# Patient Record
Sex: Male | Born: 1960 | Race: White | Hispanic: No | Marital: Married | Smoking: Former smoker
Health system: Southern US, Community
[De-identification: ages and names within clinical notes are randomized; demographics above are authoritative.]

## PROBLEM LIST (undated history)

## (undated) DIAGNOSIS — T4145XA Adverse effect of unspecified anesthetic, initial encounter: Secondary | ICD-10-CM

## (undated) DIAGNOSIS — I10 Essential (primary) hypertension: Secondary | ICD-10-CM

## (undated) DIAGNOSIS — T8859XA Other complications of anesthesia, initial encounter: Secondary | ICD-10-CM

## (undated) DIAGNOSIS — E8809 Other disorders of plasma-protein metabolism, not elsewhere classified: Secondary | ICD-10-CM

## (undated) HISTORY — PX: OTHER SURGICAL HISTORY: SHX169

---

## 2013-12-02 LAB — BASIC METABOLIC PANEL
Creatinine: 1.1 mg/dL (ref 0.6–1.3)
GLUCOSE: 90 mg/dL

## 2013-12-02 LAB — CBC AND DIFFERENTIAL
Neutrophils Absolute: 3 /uL
WBC: 6.4 10^3/mL

## 2013-12-02 LAB — HEPATIC FUNCTION PANEL: Bilirubin, Total: 0.4 mg/dL

## 2014-06-21 DIAGNOSIS — I10 Essential (primary) hypertension: Secondary | ICD-10-CM | POA: Insufficient documentation

## 2015-02-22 DIAGNOSIS — Z87442 Personal history of urinary calculi: Secondary | ICD-10-CM | POA: Insufficient documentation

## 2015-04-27 DIAGNOSIS — E8889 Other specified metabolic disorders: Secondary | ICD-10-CM | POA: Insufficient documentation

## 2015-06-07 DIAGNOSIS — N200 Calculus of kidney: Secondary | ICD-10-CM | POA: Insufficient documentation

## 2015-07-18 ENCOUNTER — Emergency Department: Payer: PRIVATE HEALTH INSURANCE

## 2015-07-18 ENCOUNTER — Other Ambulatory Visit: Payer: Self-pay

## 2015-07-18 ENCOUNTER — Encounter: Payer: Self-pay | Admitting: Medical Oncology

## 2015-07-18 ENCOUNTER — Emergency Department
Admission: EM | Admit: 2015-07-18 | Discharge: 2015-07-18 | Disposition: A | Discharge status: Home / Self Care | Payer: PRIVATE HEALTH INSURANCE | Attending: Emergency Medicine | Admitting: Emergency Medicine

## 2015-07-18 DIAGNOSIS — I10 Essential (primary) hypertension: Secondary | ICD-10-CM | POA: Insufficient documentation

## 2015-07-18 DIAGNOSIS — R1013 Epigastric pain: Secondary | ICD-10-CM | POA: Diagnosis present

## 2015-07-18 DIAGNOSIS — K8500 Idiopathic acute pancreatitis without necrosis or infection: Secondary | ICD-10-CM | POA: Diagnosis not present

## 2015-07-18 HISTORY — DX: Essential (primary) hypertension: I10

## 2015-07-18 HISTORY — DX: Other disorders of plasma-protein metabolism, not elsewhere classified: E88.09

## 2015-07-18 LAB — COMPREHENSIVE METABOLIC PANEL
ALBUMIN: 4.9 g/dL (ref 3.5–5.0)
ALT: 20 U/L (ref 17–63)
ANION GAP: 7 (ref 5–15)
AST: 26 U/L (ref 15–41)
Alkaline Phosphatase: 77 U/L (ref 38–126)
BILIRUBIN TOTAL: 0.7 mg/dL (ref 0.3–1.2)
BUN: 18 mg/dL (ref 6–20)
CHLORIDE: 105 mmol/L (ref 101–111)
CO2: 27 mmol/L (ref 22–32)
Calcium: 9.5 mg/dL (ref 8.9–10.3)
Creatinine, Ser: 0.97 mg/dL (ref 0.61–1.24)
GFR calc Af Amer: >60 mL/min (ref >60)
Glucose, Bld: 142 mg/dL — ABNORMAL HIGH (ref 65–99)
POTASSIUM: 3.8 mmol/L (ref 3.5–5.1)
Sodium: 139 mmol/L (ref 135–145)
TOTAL PROTEIN: 8 g/dL (ref 6.5–8.1)

## 2015-07-18 LAB — URINALYSIS COMPLETE WITH MICROSCOPIC (ARMC ONLY)
BACTERIA UA: NONE SEEN
Bilirubin Urine: NEGATIVE
Glucose, UA: NEGATIVE mg/dL
Hgb urine dipstick: NEGATIVE
Ketones, ur: NEGATIVE mg/dL
LEUKOCYTES UA: NEGATIVE
NITRITE: NEGATIVE
PH: 7 (ref 5.0–8.0)
PROTEIN: NEGATIVE mg/dL
Specific Gravity, Urine: >1.03 — ABNORMAL HIGH (ref 1.005–1.030)

## 2015-07-18 LAB — CBC
HEMATOCRIT: 44.6 % (ref 40.0–52.0)
HEMOGLOBIN: 14.4 g/dL (ref 13.0–18.0)
MCH: 28.7 pg (ref 26.0–34.0)
MCHC: 32.3 g/dL (ref 32.0–36.0)
MCV: 88.8 fL (ref 80.0–100.0)
Platelets: 302 10*3/uL (ref 150–440)
RBC: 5.02 MIL/uL (ref 4.40–5.90)
RDW: 13.6 % (ref 11.5–14.5)
WBC: 23.9 10*3/uL — AB (ref 3.8–10.6)

## 2015-07-18 LAB — LIPASE, BLOOD: LIPASE: 63 U/L — AB (ref 11–51)

## 2015-07-18 MED ORDER — IOHEXOL 350 MG/ML SOLN
100.0000 mL | Freq: Once | INTRAVENOUS | Status: AC | PRN
  Administered 2015-07-18: 100 mL via INTRAVENOUS

## 2015-07-18 MED ORDER — HYDROMORPHONE HCL 1 MG/ML IJ SOLN
1.0000 mg | Freq: Once | INTRAMUSCULAR | Status: AC
  Administered 2015-07-18: 1 mg via INTRAVENOUS
  Filled 2015-07-18: qty 1

## 2015-07-18 MED ORDER — ONDANSETRON HCL 4 MG/2ML IJ SOLN
4.0000 mg | Freq: Once | INTRAMUSCULAR | Status: AC
  Administered 2015-07-18: 4 mg via INTRAVENOUS
  Filled 2015-07-18: qty 2

## 2015-07-18 MED ORDER — IOHEXOL 240 MG/ML SOLN
25.0000 mL | Freq: Once | INTRAMUSCULAR | Status: AC | PRN
  Administered 2015-07-18: 25 mL via ORAL

## 2015-07-18 MED ORDER — OXYCODONE-ACETAMINOPHEN 5-325 MG PO TABS: 2.0000 | ORAL_TABLET | Freq: Four times a day (QID) | ORAL | Status: DC | PRN

## 2015-07-18 MED ORDER — ONDANSETRON HCL 4 MG PO TABS: 4.0000 mg | ORAL_TABLET | Freq: Every day | ORAL | Status: DC | PRN

## 2015-07-18 MED ORDER — PANTOPRAZOLE SODIUM 40 MG PO TBEC: 40.0000 mg | DELAYED_RELEASE_TABLET | Freq: Every day | ORAL | Status: DC

## 2015-07-18 NOTE — ED Notes (Signed)
Discussed discharge instructions, prescriptions, and follow-up care with patient. No questions or concerns at this time. Pt stable at discharge.  

## 2015-07-18 NOTE — ED Notes (Signed)
Pt ambulatory to triage with reports epigastic pain since 10pm also reports nausea with vomiting x 1.

## 2015-07-18 NOTE — ED Provider Notes (Addendum)
[facility] Emergency Department Provider Note     Time seen: ----------------------------------------- 8:45 AM on 07/18/2015 -----------------------------------------    I have reviewed the triage vital signs and the nursing notes.   HISTORY  Chief Complaint Abdominal Pain    HPI Nochum Fenter is a 54 y.o. male who presents to ER with epigastric pain since 10 PM last night with associated nausea and vomiting times one. Patient states is not currently nauseous, has never had this pain before, some in the epigastrium. Nothing makes it better or worse.   Past Medical History  Diagnosis Date  . Hypertension   . Pseudocholinesterase deficiency     There are no active problems to display for this patient.   Past Surgical History  Procedure Laterality Date  . Kidney stones removed      Allergies Sulfa antibiotics  Social History Social History  Substance Use Topics  . Smoking status: Never Smoker   . Smokeless tobacco: None  . Alcohol Use: No    Review of Systems Constitutional: Negative for fever. Eyes: Negative for visual changes. ENT: Negative for sore throat. Cardiovascular: Negative for chest pain. Respiratory: Negative for shortness of breath. Gastrointestinal: Positive for abdominal pain and vomiting Genitourinary: Negative for dysuria. Musculoskeletal: Negative for back pain. Skin: Negative for rash. Neurological: Negative for headaches, focal weakness or numbness.  10-point ROS otherwise negative.  ____________________________________________   PHYSICAL EXAM:  VITAL SIGNS: ED Triage Vitals  Enc Vitals Group     BP 07/18/15 0817 166/63 mmHg     Pulse Rate 07/18/15 0817 71     Resp 07/18/15 0817 20     Temp 07/18/15 0817 97.9 F (36.6 C)     Temp Source 07/18/15 0817 Oral     SpO2 07/18/15 0817 99 %     Weight 07/18/15 0817 207 lb (93.895 kg)     Height 07/18/15 0817  (1.702 m)     Head Cir --      Peak  Flow --      Pain Score 07/18/15 0818 4     Pain Loc --      Pain Edu? --      Excl. in GC? --     Constitutional: Alert and oriented. Well appearing and in no distress. Eyes: Conjunctivae are normal. PERRL. Normal extraocular movements. ENT   Head: Normocephalic and atraumatic.   Nose: No congestion/rhinnorhea.   Mouth/Throat: Mucous membranes are moist.   Neck: No stridor. Cardiovascular: Normal rate, regular rhythm. Normal and symmetric distal pulses are present in all extremities. No murmurs, rubs, or gallops. Respiratory: Normal respiratory effort without tachypnea nor retractions. Breath sounds are clear and equal bilaterally. No wheezes/rales/rhonchi. Gastrointestinal: Mild epigastric tenderness, no rebound or guarding. Normal bowel sounds. Musculoskeletal: Nontender with normal range of motion in all extremities. No joint effusions.  No lower extremity tenderness nor edema. Neurologic:  Normal speech and language. No gross focal neurologic deficits are appreciated. Speech is normal. No gait instability. Skin:  Skin is warm, dry and intact. No rash noted. Psychiatric: Mood and affect are normal. Speech and behavior are normal. Patient exhibits appropriate insight and judgment. ____________________________________________  EKG: Interpreted by me. Normal sinus rhythm with a rate of 74 bpm, normal PR interval, normal QRS width, normal QT interval.  ____________________________________________  ED COURSE:  Pertinent labs & imaging results that were available during my care of the patient were reviewed by me and considered in my medical decision making (see chart for details). Patient  is in no acute distress, will check basic labs. Suspect GI etiology. ____________________________________________    LABS (pertinent positives/negatives)  Labs Reviewed  LIPASE, BLOOD - Abnormal; Notable for the following:    Lipase 63 (*)    All other components within normal limits   COMPREHENSIVE METABOLIC PANEL - Abnormal; Notable for the following:    Glucose, Bld 142 (*)    All other components within normal limits  CBC - Abnormal; Notable for the following:    WBC 23.9 (*)    All other components within normal limits  URINALYSIS COMPLETEWITH MICROSCOPIC ([facility] ONLY)    RADIOLOGY Images were viewed by me  Abdomen 2 view IMPRESSION: No acute abnormality or finding to explain the patient's symptoms.  Small fat containing left periumbilical hernia.  0.5 cm and 0.4 cm nonobstructing left renal stones. There may also be a punctate nonobstructing stone in the upper pole the right kidney. ____________________________________________  FINAL ASSESSMENT AND PLAN  Epigastric pain, pancreatitis  Plan: Patient with labs and imaging as dictated above. Patient with leukocytosis of unclear etiology, this may be a stress response to what looks like pancreatitis. Patient states she does not drink alcohol, and gallbladder looks normal. He will be discharged with pain medicine and encouraged to have close follow-up with his doctor in for evaluation   Emily FilbertWilliams, Mehgan Santmyer E, MD   Emily FilbertJonathan E Anaisabel Pederson, MD 07/18/15 1027  Emily FilbertJonathan E Gwendlyon Zumbro, MD 07/18/15 [phone]

## 2015-07-18 NOTE — Discharge Instructions (Signed)

## 2015-08-17 ENCOUNTER — Ambulatory Visit: Payer: Self-pay | Admitting: Physician Assistant

## 2015-08-24 ENCOUNTER — Ambulatory Visit
Admission: RE | Admit: 2015-08-24 | Discharge: 2015-08-24 | Disposition: A | Discharge status: Home / Self Care | Payer: BLUE CROSS/BLUE SHIELD | Source: Ambulatory Visit | Attending: Physician Assistant | Admitting: Physician Assistant

## 2015-08-24 ENCOUNTER — Encounter: Payer: Self-pay | Admitting: Physician Assistant

## 2015-08-24 ENCOUNTER — Telehealth: Payer: Self-pay

## 2015-08-24 ENCOUNTER — Ambulatory Visit (INDEPENDENT_AMBULATORY_CARE_PROVIDER_SITE_OTHER): Payer: BLUE CROSS/BLUE SHIELD | Admitting: Physician Assistant

## 2015-08-24 VITALS — BP 130/80 | HR 60 | Temp 97.6°F | Resp 16 | Ht 67.0 in | Wt 217.4 lb

## 2015-08-24 DIAGNOSIS — Z7689 Persons encountering health services in other specified circumstances: Secondary | ICD-10-CM

## 2015-08-24 DIAGNOSIS — M25562 Pain in left knee: Secondary | ICD-10-CM

## 2015-08-24 DIAGNOSIS — Z7189 Other specified counseling: Secondary | ICD-10-CM

## 2015-08-24 DIAGNOSIS — I10 Essential (primary) hypertension: Secondary | ICD-10-CM

## 2015-08-24 NOTE — Telephone Encounter (Signed)
-----   Message from Margaretann Loveless, [place] sent at 08/24/2015 10:52 AM EST ----- X-ray does show mild arthritis in the medial compartment and behind the kneecap. Continue current treatment plan as discussed and we will plan on a cortisone injection when you return in 4 weeks.

## 2015-08-24 NOTE — Telephone Encounter (Signed)
Patient advised as below. sd 

## 2015-08-24 NOTE — Progress Notes (Signed)
Patient: Maurice Lee, Male    DOB: [birth year], 55 y.o.   MRN: [id number] Visit Date: 08/24/2015  Today's Provider: Margaretann Loveless, PA-C   Chief Complaint  Patient presents with  . Establish Care  . Knee Pain   Subjective:    Establish Care: Maurice Lee is a 55 y.o. male who presents today to established care as a new patient. He feels well. He reports exercising-at work. He reports he is sleeping well.   Knee Pain: Patient presents with knee pain involving the  left knee. Onset of the symptoms was several years ago. Inciting event: none known. Current symptoms include pain. Pain is aggravated by with the cold weather..  Patient has had prior knee problems. Evaluation to date: Xray with previous doctor. Treatment to date: Naproxen.    Review of Systems  Constitutional: Negative.   HENT: Negative.   Eyes: Negative.   Respiratory: Negative.   Cardiovascular: Negative.   Gastrointestinal: Negative.   Endocrine: Negative.   Genitourinary: Negative.   Musculoskeletal: Positive for arthralgias.  Skin: Negative.   Allergic/Immunologic: Negative.   Neurological: Negative.   Hematological: Negative.   Psychiatric/Behavioral: Negative.     Social History      He  reports that he has quit smoking. He does not have any smokeless tobacco history on file. He reports that he does not drink alcohol or use illicit drugs.       Social History   Social History  . Marital Status: Married    Spouse Name: N/A  . Number of Children: N/A  . Years of Education: N/A   Social History Main Topics  . Smoking status: Former Games developer  . Smokeless tobacco: None  . Alcohol Use: No  . Drug Use: No  . Sexual Activity: Not Asked   Other Topics Concern  . None   Social History Narrative    Past Medical History  Diagnosis Date  . Hypertension   . Pseudocholinesterase deficiency      Patient Active Problem List   Diagnosis Date Noted  . Calculus of kidney  06/07/2015  . Serum cholinesterase defect (HCC) 04/27/2015  . H/O renal calculi 02/22/2015  . Essential (primary) hypertension 06/21/2014    Past Surgical History  Procedure Laterality Date  . Kidney stones removed      Family History        No family status information on file.        His family history is not on file.    Allergies  Allergen Reactions  . 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 Sulfa Antibiotics     Previous Medications   LISINOPRIL (PRINIVIL,ZESTRIL) 40 MG TABLET    Take 1 tablet by mouth daily at 12 noon.    NAPROXEN DR 500 MG EC TABLET    Take 1 tablet by mouth 2 (two) times daily as needed.   OXYCODONE-ACETAMINOPHEN (PERCOCET) 5-325 MG TABLET    Take 2 tablets by mouth every 6 (six) hours as needed for moderate pain or severe pain.   PANTOPRAZOLE (PROTONIX) 40 MG TABLET    Take 1 tablet (40 mg total) by mouth daily.   RANITIDINE (ZANTAC) 300 MG TABLET    Take 1 tablet by mouth as needed.    Patient Care Team:  Margaretann Loveless, PA-C as PCP - General (Family Medicine)     Objective:   Vitals: BP 130/80 mmHg  Pulse 60  Temp(Src) 97.6 F (36.4 C) (Oral)  Resp 16  Ht  (1.702 m)  Wt 217 lb 6.4 oz (98.612 kg)  BMI 34.04 kg/m2   Physical Exam  Constitutional: He is oriented to person, place, and time. He appears well-developed and well-nourished.  HENT:  Head: Normocephalic and atraumatic.  Right Ear: External ear normal.  Left Ear: External ear normal.  Nose: Nose normal.  Mouth/Throat: Oropharynx is clear and moist.  Eyes: Conjunctivae and EOM are normal. Pupils are equal, round, and reactive to light. Right eye exhibits no discharge.  Neck: Normal range of motion. Neck supple. No tracheal deviation present. No thyromegaly present.   Cardiovascular: Normal rate, regular rhythm, normal heart sounds and intact distal pulses.   No murmur heard. Pulmonary/Chest: Effort normal and breath sounds normal. No respiratory distress. He has no wheezes. He has no rales. He exhibits no tenderness.  Abdominal: Soft. He exhibits no distension and no mass. There is no tenderness. There is no rebound and no guarding.  Musculoskeletal: Normal range of motion. He exhibits no edema.       Right knee: Normal.       Left knee: He exhibits normal range of motion, no swelling, no effusion, no erythema, normal alignment, no LCL laxity, normal patellar mobility, no bony tenderness, normal meniscus and no MCL laxity. Tenderness found. Medial joint line tenderness noted. No lateral joint line, no MCL, no LCL and no patellar tendon tenderness noted.  Lymphadenopathy:    He has no cervical adenopathy.  Neurological: He is alert and oriented to person, place, and time. He has normal reflexes. No cranial nerve deficit. He exhibits normal muscle tone. Coordination normal.  Skin: Skin is warm and dry. No rash noted. No erythema.  Psychiatric: He has a normal mood and affect. His behavior is normal. Judgment and thought content normal.     Depression Screen No flowsheet data found.    Assessment & Plan:     Routine Health Maintenance and Physical Exam  1. Establishing care with new doctor, encounter for  2. Left knee pain Left knee did have medial joint line tenderness. All range of motion testing, ligament and meniscal testing and strength testing were all within normal limits. I will get an x-ray of the left knee to evaluate for possible arthritis. He is to continue taking naproxen twice daily as needed for pain. On bad days he may take 2 pills twice daily of naproxen. I will see him back in 4 weeks to see how he is doing and consider cortisone injection at that time if x-ray is normal. - DG Knee Complete 4 Views Left; Future  3. Essential  (primary) hypertension I will have him discontinue lisinopril for the next 4 weeks as he does not feel it is helping his blood pressure. I did advise him to keep a log of his blood pressures without the medication and I will see him back in 4 weeks. He is to bring this log with him for this return visit. If blood pressure is elevated at home readings then we will add a different agent to see if we can achieve better control of his blood pressure. He is to call the office if he has any acute issues, questions or concerns in the meantime.   Exercise Activities and Dietary recommendations Goals    None  There is no immunization history on file for this patient.  Health Maintenance  Topic Date Due  . Hepatitis C Screening  [birth year]  . HIV Screening  09/20/1977  . TETANUS/TDAP  09/20/1981  . COLONOSCOPY  09/20/2012  . INFLUENZA VACCINE  02/27/2015      Discussed health benefits of physical activity, and encouraged him to engage in regular exercise appropriate for his age and condition.    --------------------------------------------------------------------

## 2015-08-24 NOTE — Patient Instructions (Signed)

## 2015-09-18 ENCOUNTER — Ambulatory Visit: Payer: BLUE CROSS/BLUE SHIELD | Admitting: Physician Assistant

## 2015-09-26 ENCOUNTER — Encounter: Payer: Self-pay | Admitting: Physician Assistant

## 2015-09-26 ENCOUNTER — Ambulatory Visit (INDEPENDENT_AMBULATORY_CARE_PROVIDER_SITE_OTHER): Payer: BLUE CROSS/BLUE SHIELD | Admitting: Physician Assistant

## 2015-09-26 VITALS — BP 150/90 | HR 71 | Temp 97.8°F | Resp 16 | Wt 217.2 lb

## 2015-09-26 DIAGNOSIS — M1712 Unilateral primary osteoarthritis, left knee: Secondary | ICD-10-CM | POA: Diagnosis not present

## 2015-09-26 DIAGNOSIS — I10 Essential (primary) hypertension: Secondary | ICD-10-CM | POA: Diagnosis not present

## 2015-09-26 DIAGNOSIS — N200 Calculus of kidney: Secondary | ICD-10-CM

## 2015-09-26 DIAGNOSIS — M25562 Pain in left knee: Secondary | ICD-10-CM

## 2015-09-26 DIAGNOSIS — M222X2 Patellofemoral disorders, left knee: Secondary | ICD-10-CM | POA: Diagnosis not present

## 2015-09-26 MED ORDER — NAPROXEN DR 500 MG PO TBEC: 500.0000 mg | DELAYED_RELEASE_TABLET | Freq: Two times a day (BID) | ORAL | Status: DC | PRN

## 2015-09-26 MED ORDER — METHYLPREDNISOLONE ACETATE 80 MG/ML IJ SUSP
80.0000 mg | Freq: Once | INTRAMUSCULAR | Status: AC
  Administered 2015-09-27: 80 mg via INTRA_ARTICULAR

## 2015-09-26 MED ORDER — LISINOPRIL 40 MG PO TABS: 40.0000 mg | ORAL_TABLET | Freq: Every day | ORAL | Status: DC

## 2015-09-26 NOTE — Progress Notes (Signed)
Patient: Maurice Lee Male    DOB: September 01, 1960   55 y.o.   MRN: [id number] Visit Date: 09/26/2015  Today's Provider: Margaretann Loveless, PA-C   Chief Complaint  Patient presents with  . Follow-up    Hypertension and Left knee   Subjective:    HPI   Hypertension, follow-up:  BP Readings from Last 3 Encounters:  09/26/15 150/90  08/24/15 130/80  07/18/15 165/78    He was last seen for hypertension 4 weeks ago.  BP at that visit was 130/80. Management since that visit includes Lisinopril was discontinued and he was advised to keep a log of the blood pressure readings with out medication. He reports fair compliance with treatment. He is not having side effects.  He is exercising. He is adherent to low salt diet.   Outside blood pressures are 170's and 80's, but last night it was 180/100. He is experiencing none.  Patient denies chest pain, chest pressure/discomfort, fatigue, irregular heart beat, lower extremity edema, orthopnea, palpitations, paroxysmal nocturnal dyspnea and syncope.      Weight trend: stable Wt Readings from Last 3 Encounters:  09/26/15 217 lb 3.2 oz (98.521 kg)  08/24/15 217 lb 6.4 oz (98.612 kg)  07/18/15 207 lb (93.895 kg)    Current diet: in general, an "unhealthy" diet  Left Knee Pain: He is also here to follow-up on his left knee pain. He was advised to continue using Naproxen twice daily as needed for pain. His xray showed mild arthritis in the medial compartment and behind the patella. He says he feels the pain when it is cold.  He does do a lot of going up and down stairs when he is at work and he works 12 hr days.      Allergies  Allergen Reactions  . 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 Sulfa Antibiotics    Previous  Medications   NAPROXEN DR 500 MG EC TABLET    Take 1 tablet by mouth 2 (two) times daily as needed.   PANTOPRAZOLE (PROTONIX) 40 MG TABLET    Take 1 tablet (40 mg total) by mouth daily.   RANITIDINE (ZANTAC) 300 MG TABLET    Take 1 tablet by mouth as needed.    Review of Systems  Constitutional: Negative for fatigue.  HENT: Negative.   Eyes: Negative for visual disturbance.  Respiratory: Negative for cough, chest tightness and shortness of breath.   Cardiovascular: Negative for chest pain, palpitations and leg swelling.  Gastrointestinal: Negative for nausea, vomiting and abdominal pain.  Musculoskeletal: Positive for arthralgias (left knee). Negative for joint swelling and gait problem.  Neurological: Negative for dizziness, light-headedness and headaches.    Social History  Substance Use Topics  . Smoking status: Former Games developer  . Smokeless tobacco: Not on file  . Alcohol Use: No   Objective:   BP 150/90 mmHg  Pulse 71  Temp(Src) 97.8 F (36.6 C) (Oral)  Resp 16  Wt 217 lb 3.2 oz (98.521 kg)  Physical Exam  Constitutional: He appears well-developed and well-nourished. No distress.  HENT:  Head: Normocephalic and atraumatic.  Cardiovascular: Normal rate, regular rhythm and normal heart sounds.  Exam reveals no gallop and no friction rub.   No murmur heard. Pulmonary/Chest: Effort normal and breath  sounds normal. No respiratory distress. He has no wheezes. He has no rales.  Musculoskeletal:       Right knee: Normal.       Left knee: He exhibits normal range of motion, no swelling, no effusion, normal alignment, no LCL laxity, normal patellar mobility, no bony tenderness, normal meniscus and no MCL laxity. Tenderness found. Medial joint line and lateral joint line tenderness noted. No MCL, no LCL and no patellar tendon tenderness noted.  Skin: He is not diaphoretic.  Vitals reviewed.       Assessment & Plan:     1. Essential (primary) hypertension Will restart  lisinopril at previous dose since he had good control of BP at that time. Advised to adhere to low fat, low salt diet.  Try to limit portion size and begin an exercise regimen of 150 min physical activity per week per AHA guidelines. He is to keep a log of his BP readings over the next week or so with starting the medication back to make sure he is getting good control. I will see him back in 4 weeks to make sure that he has good control.  - lisinopril (PRINIVIL,ZESTRIL) 40 MG tablet; Take 1 tablet (40 mg total) by mouth daily.  Dispense: 90 tablet; Refill: 3  2. Primary osteoarthritis of left knee Cortisone injection given into left knee today without complication. See procedure note below. Naproxen refilled as below. May require referral to ortho if not much improvement.  Also consider PT as well. - methylPREDNISolone acetate (DEPO-MEDROL) injection 80 mg; Inject 1 mL (80 mg total) into the articular space once. - NAPROXEN DR 500 MG EC tablet; Take 1 tablet (500 mg total) by mouth 2 (two) times daily as needed.  Dispense: 60 tablet; Refill: 0  3. Patellofemoral arthralgia of left knee See above medical treatment plan. - methylPREDNISolone acetate (DEPO-MEDROL) injection 80 mg; Inject 1 mL (80 mg total) into the articular space once. - NAPROXEN DR 500 MG EC tablet; Take 1 tablet (500 mg total) by mouth 2 (two) times daily as needed.  Dispense: 60 tablet; Refill: 0  4. Kidney stones H/O kidney stones and now having occasional and intermittent right side flank pain.  Patient would like to establish with Urology locally.  Wife is seen by Dr. Marvel Plan and he would like to establish with her as well. - Ambulatory referral to Urology  Procedure Note: Procedure, benefits, and risks (including bleeding, infection, allergic reaction, tattooing and fat atrophy) were explained to the patient. All questions were sought and answered. Patient agreed to proceed. Verbal consent was obtained.   The left knee was  then prepped with betadine swabs and draped in a sterile fashion.  4cc of plain 1% xylocaine and 1cc of  depo-medrol were injected into the left knee using a 22 gauge needle. No blood loss. No complications. He tolerated the procedure well.  After care for cortisone injection was explained to the patient. He is to call if he develops any complications or signs of infection.      Margaretann Loveless, PA-C  [facility]

## 2015-09-27 ENCOUNTER — Ambulatory Visit: Payer: BLUE CROSS/BLUE SHIELD | Admitting: Physician Assistant

## 2015-09-27 DIAGNOSIS — M222X2 Patellofemoral disorders, left knee: Secondary | ICD-10-CM

## 2015-09-27 DIAGNOSIS — M1712 Unilateral primary osteoarthritis, left knee: Secondary | ICD-10-CM

## 2015-09-27 NOTE — Patient Instructions (Signed)
Knee Injection, Care After Refer to this sheet in the next few weeks. These instructions provide you with information about caring for yourself after your procedure. Your health care provider may also give you more specific instructions. Your treatment has been planned according to current medical practices, but problems sometimes occur. Call your health care provider if you have any problems or questions after your procedure. WHAT TO EXPECT AFTER THE PROCEDURE After your procedure, it is common to have:  Soreness.  Warmth.  Swelling. You may have more pain, swelling, and warmth than you did before the injection. This reaction may last for about one day.  HOME CARE INSTRUCTIONS Bathing  If you were given a bandage (dressing), keep it dry until your health care provider says it can be removed. Ask your health care provider when you can start showering or taking a bath. Managing Pain, Stiffness, and Swelling  If directed, apply ice to the injection area:  Put ice in a plastic bag.  Place a towel between your skin and the bag.  Leave the ice on for 20 minutes, 2-3 times per day.  Do not apply heat to your knee.  Raise the injection area above the level of your heart while you are sitting or lying down. Activity  Avoid strenuous activities for as long as directed by your health care provider. Ask your health care provider when you can return to your normal activities. General Instructions  Take medicines only as directed by your health care provider.  Do not take aspirin or other over-the-counter medicines unless your health care provider says you can.  Check your injection site every day for signs of infection. Watch for:  Redness, swelling, or pain.  Fluid, blood, or pus.  Follow your health care provider's instructions about dressing changes and removal. SEEK MEDICAL CARE IF:  You have symptoms at your injection site that last longer than two days after your  procedure.  You have redness, swelling, or pain in your injection area.  You have fluid, blood, or pus coming from your injection site.  You have warmth in your injection area.  You have a fever.  Your pain is not controlled with medicine. SEEK IMMEDIATE MEDICAL CARE IF:  Your knee turns very red.  Your knee becomes very swollen.  Your knee pain is severe.   This information is not intended to replace advice given to you by your health care provider. Make sure you discuss any questions you have with your health care provider.   Document Released: 08/05/2014 Document Reviewed: 08/05/2014 Elsevier Interactive Patient Education 2016 Else[employer] Inc.  Knee Injection A knee injection is a procedure to get medicine into your knee joint. Your health care provider puts a needle into the joint and injects medicine with an attached syringe. The injected medicine may relieve the pain, swelling, and stiffness of arthritis. The injected medicine may also help to lubricate and cushion your knee joint. You may need more than one injection. LET [facility] CARE PROVIDER KNOW ABOUT:  Any allergies you have.  All medicines you are taking, including vitamins, herbs, eye drops, creams, and over-the-counter medicines.  Previous problems you or members of your family have had with the use of anesthetics.  Any blood disorders you have.  Previous surgeries you have had.  Any medical conditions you may have. RISKS AND COMPLICATIONS Generally, this is a safe procedure. However, problems may occur, including:  Infection.  Bleeding.  Worsening symptoms.  Damage to the area around your  knee.  Allergic reaction to any of the medicines.  Skin reactions from repeated injections. BEFORE THE PROCEDURE  Ask your health care provider about changing or stopping your regular medicines. This is especially important if you are taking diabetes medicines or blood thinners.  Plan to have someone take you  home after the procedure. PROCEDURE  You will sit or lie down in a position for your knee to be treated.  The skin over your kneecap will be cleaned with a germ-killing solution (antiseptic).  You will be given a medicine that numbs the area (local anesthetic). You may feel some stinging.  After your knee becomes numb, you will have a second injection. This is the medicine. This needle is carefully placed between your kneecap and your knee. The medicine is injected into the joint space.  At the end of the procedure, the needle will be removed.  A bandage (dressing) may be placed over the injection site. The procedure may vary among health care providers and hospitals. AFTER THE PROCEDURE  You may have to move your knee through its full range of motion. This helps to get all of the medicine into your joint space.  Your blood pressure, heart rate, breathing rate, and blood oxygen level will be monitored often until the medicines you were given have worn off.  You will be watched to make sure that you do not have a reaction to the injected medicine.   This information is not intended to replace advice given to you by your health care provider. Make sure you discuss any questions you have with your health care provider.   Document Released: 10/06/2006 Document Revised: 08/05/2014 Document Reviewed: 05/25/2014 Elsevier Interactive Patient Education [employer]

## 2015-10-02 ENCOUNTER — Ambulatory Visit: Payer: BLUE CROSS/BLUE SHIELD | Admitting: Obstetrics and Gynecology

## 2015-10-05 ENCOUNTER — Ambulatory Visit (INDEPENDENT_AMBULATORY_CARE_PROVIDER_SITE_OTHER): Payer: BLUE CROSS/BLUE SHIELD | Admitting: Obstetrics and Gynecology

## 2015-10-05 ENCOUNTER — Encounter: Payer: Self-pay | Admitting: Obstetrics and Gynecology

## 2015-10-05 VITALS — BP 162/76 | HR 61 | Resp 16 | Ht 67.0 in | Wt 217.0 lb

## 2015-10-05 DIAGNOSIS — R109 Unspecified abdominal pain: Secondary | ICD-10-CM

## 2015-10-05 LAB — URINALYSIS, COMPLETE
BILIRUBIN UA: NEGATIVE
Glucose, UA: NEGATIVE
Ketones, UA: NEGATIVE
LEUKOCYTES UA: NEGATIVE
Nitrite, UA: NEGATIVE
PH UA: 5.5 (ref 5.0–7.5)
Protein, UA: NEGATIVE
RBC UA: NEGATIVE
Specific Gravity, UA: 1.025 (ref 1.005–1.030)
Urobilinogen, Ur: 0.2 mg/dL (ref 0.2–1.0)

## 2015-10-05 LAB — MICROSCOPIC EXAMINATION
BACTERIA UA: NONE SEEN
EPITHELIAL CELLS (NON RENAL): NONE SEEN /HPF (ref 0–10)

## 2015-10-05 NOTE — Progress Notes (Signed)
10/05/2015 9:13 AM   Maurice Lee August 06, 1960 [id number]  Referring provider: Margaretann Loveless, PA-C [address] [place], [place] [address]  Chief Complaint  Patient presents with  . Flank Pain  . Establish Care    HPI: Patient is a 55 year old male with a history of kidney stones presenting today with complaints of right flank intermittent worsening over the last month occuring approximately twice per week. Describes pain as dull and occasionally severe and sharp.  No urinary symptoms.  No gross hematuria, fevers, nausea or vomiting.    S/p one URS in the past.  He states that he had a lot of difficulty waking up from anesthesia.    Reports prostate cancer screening performed by PCP.  PMH: Past Medical History  Diagnosis Date  . Hypertension   . Pseudocholinesterase deficiency     Surgical History: Past Surgical History  Procedure Laterality Date  . Kidney stones removed      Home Medications:    Medication List       This list is accurate as of: 10/05/15  9:13 AM.  Always use your most recent med list.               lisinopril 40 MG tablet  Commonly known as:  PRINIVIL,ZESTRIL  Take 1 tablet (40 mg total) by mouth daily.     NAPROXEN DR 500 MG EC tablet  Generic drug:  naproxen  Take 1 tablet (500 mg total) by mouth 2 (two) times daily as needed.     ZANTAC 300 MG tablet  Generic drug:  ranitidine  Take 1 tablet by mouth as needed.        Allergies:  Allergies  Allergen Reactions  . 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 Sulfa Antibiotics     Family History: History reviewed. No pertinent family history.  Social History:  reports that he has quit smoking. He does not have any smokeless tobacco history on file. He  reports that he does not drink alcohol or use illicit drugs.  ROS: UROLOGY Frequent Urination?: No Hard to postpone urination?: No Burning/pain with urination?: No Get up at night to urinate?: No Leakage of urine?: No Urine stream starts and stops?: No Trouble starting stream?: No Do you have to strain to urinate?: No Blood in urine?: No Urinary tract infection?: No Sexually transmitted disease?: No Injury to kidneys or bladder?: No Painful intercourse?: No Weak stream?: No Erection problems?: No Penile pain?: No  Gastrointestinal Nausea?: No Vomiting?: No Indigestion/heartburn?: No Diarrhea?: No Constipation?: No  Constitutional Fever: No Night sweats?: No Weight loss?: No Fatigue?: No  Skin Skin rash/lesions?: No Itching?: No  Eyes Blurred vision?: No Double vision?: No  Ears/Nose/Throat Sore throat?: No Sinus problems?: No  Hematologic/Lymphatic Swollen glands?: No Easy bruising?: No  Cardiovascular Leg swelling?: No Chest pain?: No  Respiratory Cough?: No Shortness of breath?: No  Endocrine Excessive thirst?: No  Musculoskeletal Back pain?: No Joint pain?: Yes  Neurological Headaches?: No Dizziness?: No  Psychologic Depression?: No Anxiety?: No  Physical Exam: BP 162/76 mmHg  Pulse 61  Resp 16  Ht  (1.702 m)  Wt 217 lb (98.431 kg)  BMI 33.98 kg/m2  Constitutional:  Alert and oriented, No acute distress. HEENT: [place] AT, moist mucus membranes.  Trachea  midline, no masses. Cardiovascular: No clubbing, cyanosis, or edema. Respiratory: Normal respiratory effort, no increased work of breathing. GI: Abdomen is soft, nontender, nondistended, no abdominal masses GU: mild right CVA tenderness.  Skin: No rashes, bruises or suspicious lesions. Lymph: No cervical or inguinal adenopathy. Neurologic: Grossly intact, no focal deficits, moving all 4 extremities. Psychiatric: Normal mood and affect.  Laboratory Data:  Lab Results   Component Value Date   WBC 23.9* 07/18/2015   HGB 14.4 07/18/2015   HCT 44.6 07/18/2015   MCV 88.8 07/18/2015   PLT 302 07/18/2015    Lab Results  Component Value Date   CREATININE 0.97 07/18/2015    No results found for: PSA  No results found for: TESTOSTERONE  No results found for: HGBA1C  Urinalysis    Component Value Date/Time   COLORURINE YELLOW* 07/18/2015 1149   APPEARANCEUR CLEAR* 07/18/2015 1149   LABSPEC >1.030* 07/18/2015 1149   PHURINE 7.0 07/18/2015 1149   GLUCOSEU NEGATIVE 07/18/2015 1149   HGBUR NEGATIVE 07/18/2015 1149   BILIRUBINUR NEGATIVE 07/18/2015 1149   KETONESUR NEGATIVE 07/18/2015 1149   PROTEINUR NEGATIVE 07/18/2015 1149   NITRITE NEGATIVE 07/18/2015 1149   LEUKOCYTESUR NEGATIVE 07/18/2015 1149    Pertinent Imaging: CLINICAL DATA: Epigastric pain with nausea and vomiting since 10 p.m. 07/17/2015. Initial encounter.  EXAM: CT ABDOMEN AND PELVIS WITH CONTRAST  TECHNIQUE: Multidetector CT imaging of the abdomen and pelvis was performed using the standard protocol following bolus administration of intravenous contrast.  CONTRAST: 100 mL OMNIPAQUE IOHEXOL 350 MG/ML SOLN  COMPARISON: Plain films of the abdomen earlier today.  FINDINGS: Lung bases are clear. No pleural or pericardial effusion.  The liver, gallbladder, adrenal glands, spleen and pancreas appear normal. A 0.5 cm nonobstructing stone is seen in the upper pole of the left kidney. Also seen is a 0.4 cm nonobstructing stone lower pole left kidney. There may be a punctate nonobstructing stone in the upper pole the right kidney. Small fat containing left periumbilical hernia is noted.  The stomach, small and large bowel and appendix appear normal. No lymphadenopathy or fluid is identified. Prostate gland, seminal vesicles and urinary bladder all appear normal.  No focal bony abnormality is identified.  IMPRESSION: No acute abnormality or finding to explain  the patient's symptoms.  Small fat containing left periumbilical hernia.  0.5 cm and 0.4 cm nonobstructing left renal stones. There may also be a punctate nonobstructing stone in the upper pole the right kidney.   Electronically Signed  By: Drusilla Kannerhomas Dalessio M.D.  On: 07/18/2015 10:13   Assessment & Plan:    1. Flank pain - Intermittent right flank pain. History of renal stones. UA unremarkable today. Will obtain a renal ultrasound and KUB in have patient follow up with results. He was first provided a urine strainer today. He declined to Flomax. - Urinalysis, Complete -RUS/KUB  2. Nephrolithiasis- 5mm and 4 mm nonobstructing left renal stones were seen on CT scan performed on 07/18/15. Patient reports that he is experienced multiple stone episodes over the last 6 years. One of which required URS.   Return for f/u KUB/RUS results.  These notes generated with voice recognition software. I apologize for typographical errors.  Earlie LouLindsay Rayquan Amrhein, FNP  [facility] [address], [address] [place], [place] [address] [phone]

## 2015-10-05 NOTE — Addendum Note (Signed)
Addended by: Fernanda DrumVERTON, Kamia Insalaco C on: 10/05/2015 09:59 AM   Modules accepted: Orders

## 2015-10-19 ENCOUNTER — Ambulatory Visit: Payer: BLUE CROSS/BLUE SHIELD

## 2015-10-24 ENCOUNTER — Encounter: Payer: Self-pay | Admitting: Physician Assistant

## 2015-10-24 ENCOUNTER — Ambulatory Visit (INDEPENDENT_AMBULATORY_CARE_PROVIDER_SITE_OTHER): Payer: BLUE CROSS/BLUE SHIELD | Admitting: Physician Assistant

## 2015-10-24 VITALS — BP 140/78 | HR 56 | Temp 97.6°F | Resp 16 | Wt 217.0 lb

## 2015-10-24 DIAGNOSIS — I10 Essential (primary) hypertension: Secondary | ICD-10-CM

## 2015-10-24 MED ORDER — DILTIAZEM HCL 30 MG PO TABS: 30.0000 mg | ORAL_TABLET | Freq: Every day | ORAL | Status: DC

## 2015-10-24 MED ORDER — DILTIAZEM HCL 30 MG PO TABS: 30.0000 mg | ORAL_TABLET | Freq: Four times a day (QID) | ORAL | Status: DC

## 2015-10-24 NOTE — Patient Instructions (Signed)
DASH Eating Plan  DASH stands for "Dietary Approaches to Stop Hypertension." The DASH eating plan is a healthy eating plan that has been shown to reduce high blood pressure (hypertension). Additional health benefits may include reducing the risk of type 2 diabetes mellitus, heart disease, and stroke. The DASH eating plan may also help with weight loss.  WHAT DO I NEED TO KNOW ABOUT THE DASH EATING PLAN?  For the DASH eating plan, you will follow these general guidelines:  · Choose foods with a percent daily value for sodium of less than 5% (as listed on the food label).  · Use salt-free seasonings or herbs instead of table salt or sea salt.  · Check with your health care provider or pharmacist before using salt substitutes.  · Eat lower-sodium products, often labeled as "lower sodium" or "no salt added."  · Eat fresh foods.  · Eat more vegetables, fruits, and low-fat dairy products.  · Choose whole grains. Look for the word "whole" as the first word in the ingredient list.  · Choose fish and skinless chicken or turkey more often than red meat. Limit fish, poultry, and meat to 6 oz (170 g) each day.  · Limit sweets, desserts, sugars, and sugary drinks.  · Choose heart-healthy fats.  · Limit cheese to 1 oz (28 g) per day.  · Eat more home-cooked food and less restaurant, buffet, and fast food.  · Limit fried foods.  · Cook foods using methods other than frying.  · Limit canned vegetables. If you do use them, rinse them well to decrease the sodium.  · When eating at a restaurant, ask that your food be prepared with less salt, or no salt if possible.  WHAT FOODS CAN I EAT?  Seek help from a dietitian for individual calorie needs.  Grains  Whole grain or whole wheat bread. Brown rice. Whole grain or whole wheat pasta. Quinoa, bulgur, and whole grain cereals. Low-sodium cereals. Corn or whole wheat flour tortillas. Whole grain cornbread. Whole grain crackers. Low-sodium crackers.  Vegetables  Fresh or frozen vegetables  (raw, steamed, roasted, or grilled). Low-sodium or reduced-sodium tomato and vegetable juices. Low-sodium or reduced-sodium tomato sauce and paste. Low-sodium or reduced-sodium canned vegetables.   Fruits  All fresh, canned (in natural juice), or frozen fruits.  Meat and Other Protein Products  Ground beef (85% or leaner), grass-fed beef, or beef trimmed of fat. Skinless chicken or turkey. Ground chicken or turkey. Pork trimmed of fat. All fish and seafood. Eggs. Dried beans, peas, or lentils. Unsalted nuts and seeds. Unsalted canned beans.  Dairy  Low-fat dairy products, such as skim or 1% milk, 2% or reduced-fat cheeses, low-fat ricotta or cottage cheese, or plain low-fat yogurt. Low-sodium or reduced-sodium cheeses.  Fats and Oils  Tub margarines without trans fats. Light or reduced-fat mayonnaise and salad dressings (reduced sodium). Avocado. Safflower, olive, or canola oils. Natural peanut or almond butter.  Other  Unsalted popcorn and pretzels.  The items listed above may not be a complete list of recommended foods or beverages. Contact your dietitian for more options.  WHAT FOODS ARE NOT RECOMMENDED?  Grains  White bread. White pasta. White rice. Refined cornbread. Bagels and croissants. Crackers that contain trans fat.  Vegetables  Creamed or fried vegetables. Vegetables in a cheese sauce. Regular canned vegetables. Regular canned tomato sauce and paste. Regular tomato and vegetable juices.  Fruits  Dried fruits. Canned fruit in light or heavy syrup. Fruit juice.  Meat and Other Protein   Products  Fatty cuts of meat. Ribs, chicken wings, bacon, sausage, bologna, salami, chitterlings, fatback, hot dogs, bratwurst, and packaged luncheon meats. Salted nuts and seeds. Canned beans with salt.  Dairy  Whole or 2% milk, cream, half-and-half, and cream cheese. Whole-fat or sweetened yogurt. Full-fat cheeses or blue cheese. Nondairy creamers and whipped toppings. Processed cheese, cheese spreads, or cheese  curds.  Condiments  Onion and garlic salt, seasoned salt, table salt, and sea salt. Canned and packaged gravies. Worcestershire sauce. Tartar sauce. Barbecue sauce. Teriyaki sauce. Soy sauce, including reduced sodium. Steak sauce. Fish sauce. Oyster sauce. Cocktail sauce. Horseradish. Ketchup and mustard. Meat flavorings and tenderizers. Bouillon cubes. Hot sauce. Tabasco sauce. Marinades. Taco seasonings. Relishes.  Fats and Oils  Butter, stick margarine, lard, shortening, ghee, and bacon fat. Coconut, palm kernel, or palm oils. Regular salad dressings.  Other  Pickles and olives. Salted popcorn and pretzels.  The items listed above may not be a complete list of foods and beverages to avoid. Contact your dietitian for more information.  WHERE CAN I FIND MORE INFORMATION?  National Heart, Lung, and Blood Institute: www.nhlbi.nih.gov/health/health-topics/topics/dash/     This information is not intended to replace advice given to you by your health care provider. Make sure you discuss any questions you have with your health care provider.     Document Released: 07/04/2011 Document Revised: 08/05/2014 Document Reviewed: 05/19/2013  Elsevier Interactive Patient Education ©2016 Else[employer] Inc.

## 2015-10-24 NOTE — Progress Notes (Signed)
Subjective:     Patient ID: Maurice Lee, male   DOB: March 01, 1961, 55 y.o.   MRN: [id number]  Hypertension This is a chronic problem. The problem is unchanged. The problem is uncontrolled. Associated symptoms include sweats. Pertinent negatives include no anxiety, blurred vision, chest pain, headaches, malaise/fatigue, neck pain, orthopnea, palpitations, peripheral edema, PND or shortness of breath. Improvement on treatment: wife reports that there has been no improvement on treatment and patients blood pressure readings at home and heart rate have been elevated.    Review of Systems  Constitutional: Negative.  Negative for malaise/fatigue.  Eyes: Negative for blurred vision.  Respiratory: Negative for cough, chest tightness, shortness of breath and wheezing.   Cardiovascular: Negative for chest pain, palpitations, orthopnea, leg swelling and PND.  Gastrointestinal: Negative.   Musculoskeletal: Negative for neck pain.  Neurological: Negative for dizziness, weakness, light-headedness and headaches.   Filed Vitals:   10/24/15 0823  BP: 136/62  Pulse: 56  Temp: 97.6 F (36.4 C)  Resp: 16      Objective:   Physical Exam  Constitutional: He appears well-developed and well-nourished. No distress.  HENT:  Head: Normocephalic and atraumatic.  Neck: Normal range of motion. Neck supple. No JVD present. No tracheal deviation present. No thyromegaly present.  Cardiovascular: Normal rate, regular rhythm and normal heart sounds.  Exam reveals no gallop and no friction rub.   No murmur heard. Pulmonary/Chest: Effort normal and breath sounds normal. No respiratory distress. He has no wheezes. He has no rales.  Skin: He is not diaphoretic.  Vitals reviewed.      Assessment:     1. Essential hypertension        Plan:     1. Essential hypertension I will add diltiazem as below to current lisinopril treatment. He is to continue monitoring his blood pressure at home. He is to call the  office if he has any adverse reactions to the medication. I will see him back in 4 weeks to recheck his blood pressure and adjust medications. - diltiazem (CARDIZEM) 30 MG tablet; Take 1 tablet (30 mg total) by mouth daily.  Dispense: 30 tablet; Refill: 1

## 2015-10-25 ENCOUNTER — Encounter: Payer: Self-pay | Admitting: Physician Assistant

## 2015-10-30 ENCOUNTER — Ambulatory Visit
Admission: RE | Admit: 2015-10-30 | Discharge: 2015-10-30 | Disposition: A | Discharge status: Home / Self Care | Payer: BLUE CROSS/BLUE SHIELD | Source: Ambulatory Visit | Attending: Obstetrics and Gynecology | Admitting: Obstetrics and Gynecology

## 2015-10-30 ENCOUNTER — Other Ambulatory Visit: Payer: PRIVATE HEALTH INSURANCE

## 2015-10-30 DIAGNOSIS — R109 Unspecified abdominal pain: Secondary | ICD-10-CM | POA: Diagnosis not present

## 2015-10-30 DIAGNOSIS — N289 Disorder of kidney and ureter, unspecified: Secondary | ICD-10-CM | POA: Diagnosis not present

## 2015-10-30 DIAGNOSIS — R10A0 Flank pain, unspecified side: Secondary | ICD-10-CM

## 2015-10-30 DIAGNOSIS — N200 Calculus of kidney: Secondary | ICD-10-CM | POA: Insufficient documentation

## 2015-11-02 ENCOUNTER — Ambulatory Visit (INDEPENDENT_AMBULATORY_CARE_PROVIDER_SITE_OTHER): Payer: BLUE CROSS/BLUE SHIELD | Admitting: Urology

## 2015-11-02 VITALS — BP 128/61 | HR 57 | Ht 67.0 in | Wt 214.0 lb

## 2015-11-02 DIAGNOSIS — N200 Calculus of kidney: Secondary | ICD-10-CM

## 2015-11-02 DIAGNOSIS — N2889 Other specified disorders of kidney and ureter: Secondary | ICD-10-CM | POA: Diagnosis not present

## 2015-11-02 LAB — MICROSCOPIC EXAMINATION: Bacteria, UA: NONE SEEN

## 2015-11-02 LAB — URINALYSIS, COMPLETE
Bilirubin, UA: NEGATIVE
Glucose, UA: NEGATIVE
Ketones, UA: NEGATIVE
LEUKOCYTES UA: NEGATIVE
Nitrite, UA: NEGATIVE
PH UA: 6 (ref 5.0–7.5)
Protein, UA: NEGATIVE
RBC, UA: NEGATIVE
Urobilinogen, Ur: 0.2 mg/dL (ref 0.2–1.0)

## 2015-11-02 NOTE — Progress Notes (Signed)
11/02/2015 10:15 AM   Maurice Lee 03-19-1961 [id number]  Referring provider: Margaretann Loveless, PA-C [address] [place], [place] [address]  Chief Complaint  Patient presents with  . Results    [place]UB & [place]    HPI: Patient is a 55 year old male with a history of kidney stones presenting today with complaints of right flank intermittent worsening over the last month occuring approximately twice per week. Describes pain as dull and occasionally severe and sharp.  No urinary symptoms. No gross hematuria, fevers, nausea or vomiting.   S/p one URS in the past. He states that he had a lot of difficulty waking up from anesthesia.   He had a 4 mm 5 mm stones in his left kidney that are not obstructing seen on a CT scan in December 2016.  The patient went renal ultrasound which showed 2.1 summary lesion in the right upper kidney. There is no lesion visible on the aforementioned the CT scan.  PMH: Past Medical History  Diagnosis Date  . Hypertension   . Pseudocholinesterase deficiency     Surgical History: Past Surgical History  Procedure Laterality Date  . Kidney stones removed      Home Medications:    Medication List       This list is accurate as of: 11/02/15 10:15 AM.  Always use your most recent med list.               diltiazem 30 MG tablet  Commonly known as:  CARDIZEM  Take 1 tablet (30 mg total) by mouth daily.     lisinopril 40 MG tablet  Commonly known as:  PRINIVIL,ZESTRIL  Take 1 tablet (40 mg total) by mouth daily.     NAPROXEN DR 500 MG EC tablet  Generic drug:  naproxen  Take 1 tablet (500 mg total) by mouth 2 (two) times daily as needed.     ZANTAC 300 MG tablet  Generic drug:  ranitidine  Take 1 tablet by mouth as needed.        Allergies:  Allergies  Allergen Reactions  . 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 Succinylcholine Other  (See Comments)    Pseudocholinesterase deficiency - has risk for prolonged apnea when given this drug  . Sulfa Antibiotics     Family History: No family history on file.  Social History:  reports that he has quit smoking. He does not have any smokeless tobacco history on file. He reports that he does not drink alcohol or use illicit drugs.  ROS: UROLOGY Frequent Urination?: No Hard to postpone urination?: No Burning/pain with urination?: No Get up at night to urinate?: No Leakage of urine?: No Urine stream starts and stops?: No Trouble starting stream?: No Do you have to strain to urinate?: No Blood in urine?: No Urinary tract infection?: No Sexually transmitted disease?: No Injury to kidneys or bladder?: No Painful intercourse?: No Weak stream?: No Erection problems?: No Penile pain?: No  Gastrointestinal Nausea?: No Vomiting?: No Indigestion/heartburn?: No Diarrhea?: No Constipation?: No  Constitutional Fever: No Night sweats?: No Weight loss?: No Fatigue?: No  Skin Skin rash/lesions?: No Itching?: No  Eyes Blurred vision?: No Double vision?: No  Ears/Nose/Throat Sore throat?: No Sinus problems?: No  Hematologic/Lymphatic Swollen glands?: No Easy bruising?: No  Cardiovascular Leg swelling?: No Chest pain?: No  Respiratory Cough?: No Shortness of breath?: No  Endocrine Excessive thirst?: No  Musculoskeletal Back pain?: Yes Joint pain?: No  Neurological Headaches?: No Dizziness?: No  Psychologic Depression?: No Anxiety?: No  Physical Exam: BP 128/61 mmHg  Pulse 57  Ht 5\' 7"  (1.702 m)  Wt 214 lb (97.07 kg)  BMI 33.51 kg/m2  Constitutional:  Alert and oriented, No acute distress. HEENT: [place] AT, moist mucus membranes.  Trachea midline, no masses. Cardiovascular: No clubbing, cyanosis, or edema. Respiratory: Normal respiratory effort, no increased work of breathing. GI: Abdomen is soft, nontender, nondistended, no abdominal  masses GU: No CVA tenderness.  Skin: No rashes, bruises or suspicious lesions. Lymph: No cervical or inguinal adenopathy. Neurologic: Grossly intact, no focal deficits, moving all 4 extremities. Psychiatric: Normal mood and affect.  Laboratory Data: Lab Results  Component Value Date   WBC 23.9* 07/18/2015   HGB 14.4 07/18/2015   HCT 44.6 07/18/2015   MCV 88.8 07/18/2015   PLT 302 07/18/2015    Lab Results  Component Value Date   CREATININE 0.97 07/18/2015    No results found for: PSA  No results found for: TESTOSTERONE  No results found for: HGBA1C  Urinalysis    Component Value Date/Time   COLORURINE YELLOW* 07/18/2015 1149   APPEARANCEUR Clear 10/05/2015 0844   APPEARANCEUR CLEAR* 07/18/2015 1149   LABSPEC >1.030* 07/18/2015 1149   PHURINE 7.0 07/18/2015 1149   GLUCOSEU Negative 10/05/2015 0844   HGBUR NEGATIVE 07/18/2015 1149   BILIRUBINUR Negative 10/05/2015 0844   BILIRUBINUR NEGATIVE 07/18/2015 1149   KETONESUR NEGATIVE 07/18/2015 1149   PROTEINUR Negative 10/05/2015 0844   PROTEINUR NEGATIVE 07/18/2015 1149   NITRITE Negative 10/05/2015 0844   NITRITE NEGATIVE 07/18/2015 1149   LEUKOCYTESUR Negative 10/05/2015 0844   LEUKOCYTESUR NEGATIVE 07/18/2015 1149    Pertinent Imaging: CLINICAL DATA: Intermittent right flank pain for 2-3 months. History of left sided lithotripsy.  EXAM: RENAL / URINARY TRACT ULTRASOUND COMPLETE  COMPARISON: 07/18/2015 CT abdomen/pelvis.  FINDINGS: Right Kidney:  Length: 10.3 cm. Normal right renal echogenicity and parenchymal thickness. No hydronephrosis. There is a questionable vague 2.1 x 1.9 x 2.0 cm hypoechoic lesion in the upper right kidney.  Left Kidney:  Length: 10.7 cm. Normal left renal echogenicity and parenchymal thickness. No hydronephrosis. Nonobstructing 12 x 5 x 13 mm upper left renal stone. No left renal mass.  Bladder:  Appears normal for degree of bladder distention. Normal  ureteral jets are demonstrated in the bladder lumen bilaterally.  IMPRESSION: 1. Questionable hypoechoic 2.1 cm lesion in the upper right kidney. Recommend further evaluation with renal mass protocol MRI abdomen without and with IV contrast to exclude a renal neoplasm. If MRI is not clinically feasible, a renal mass protocol CT abdomen without and with IV contrast is recommended. 2. Nonobstructing left renal stone. No hydronephrosis. 3. Normal bladder.  Assessment & Plan:    1. Right Flank pain - likely musculoskeletal in origin. No evidence of right nephrolithiasis.   2. Nephrolithiasis- 5mm and 4 mm nonobstructing left renal stones were seen on CT scan performed on 07/18/15. Patient reports that he is experienced multiple stone episodes over the last 6 years. One of which required URS. He is not interested in surgery at this time.  3. Right renal mass - patient has a 2 cm mass in the right kidney. This was not visible on the CT scan 4 months ago. This is more than likely an insignificant finding. However to be a safe side, we will obtain MRI renal mass protocol have the patient follow-up with us after that study.  Return for after MRI .  Hildred Laser, MD  [facility] [address], [address] [place], [place] [address] [phone]

## 2015-11-03 LAB — BASIC METABOLIC PANEL
BUN/Creatinine Ratio: 16 (ref 9–20)
BUN: 18 mg/dL (ref 6–24)
CALCIUM: 9.4 mg/dL (ref 8.7–10.2)
CHLORIDE: 101 mmol/L (ref 96–106)
CO2: 21 mmol/L (ref 18–29)
Creatinine, Ser: 1.14 mg/dL (ref 0.76–1.27)
GFR calc non Af Amer: 73 mL/min/{1.73_m2} (ref >59)
GFR, EST AFRICAN AMERICAN: 84 mL/min/{1.73_m2} (ref >59)
GLUCOSE: 100 mg/dL — AB (ref 65–99)
POTASSIUM: 4.1 mmol/L (ref 3.5–5.2)
Sodium: 141 mmol/L (ref 134–144)

## 2015-11-15 ENCOUNTER — Ambulatory Visit (INDEPENDENT_AMBULATORY_CARE_PROVIDER_SITE_OTHER): Payer: BLUE CROSS/BLUE SHIELD | Admitting: Physician Assistant

## 2015-11-15 ENCOUNTER — Encounter: Payer: Self-pay | Admitting: Physician Assistant

## 2015-11-15 VITALS — BP 148/80 | HR 64 | Temp 98.0°F | Resp 16 | Wt 216.4 lb

## 2015-11-15 DIAGNOSIS — R05 Cough: Secondary | ICD-10-CM

## 2015-11-15 DIAGNOSIS — J029 Acute pharyngitis, unspecified: Secondary | ICD-10-CM

## 2015-11-15 DIAGNOSIS — J020 Streptococcal pharyngitis: Secondary | ICD-10-CM

## 2015-11-15 DIAGNOSIS — M222X2 Patellofemoral disorders, left knee: Secondary | ICD-10-CM | POA: Diagnosis not present

## 2015-11-15 DIAGNOSIS — M1712 Unilateral primary osteoarthritis, left knee: Secondary | ICD-10-CM

## 2015-11-15 DIAGNOSIS — J028 Acute pharyngitis due to other specified organisms: Secondary | ICD-10-CM

## 2015-11-15 DIAGNOSIS — B9689 Other specified bacterial agents as the cause of diseases classified elsewhere: Secondary | ICD-10-CM

## 2015-11-15 DIAGNOSIS — M25562 Pain in left knee: Secondary | ICD-10-CM

## 2015-11-15 DIAGNOSIS — R059 Cough, unspecified: Secondary | ICD-10-CM

## 2015-11-15 LAB — POCT RAPID STREP A (OFFICE): RAPID STREP A SCREEN: NEGATIVE

## 2015-11-15 MED ORDER — NAPROXEN 500 MG PO TBEC
500.0000 mg | DELAYED_RELEASE_TABLET | Freq: Two times a day (BID) | ORAL | Status: DC | PRN
Start: 2015-11-15 — End: 2016-09-17

## 2015-11-15 MED ORDER — HYDROCODONE-HOMATROPINE 5-1.5 MG/5ML PO SYRP: 5.0000 mL | ORAL_SOLUTION | Freq: Three times a day (TID) | ORAL | Status: DC | PRN

## 2015-11-15 MED ORDER — AMOXICILLIN 875 MG PO TABS: 875.0000 mg | ORAL_TABLET | Freq: Two times a day (BID) | ORAL | Status: DC

## 2015-11-15 NOTE — Patient Instructions (Signed)

## 2015-11-15 NOTE — Progress Notes (Signed)
Patient: Maurice Lee Male    DOB: [birth year]   55 y.o.   MRN: [id number] Visit Date: 11/15/2015  Today's Provider: Margaretann LovelessJennifer M Ieshia Hatcher, PA-C   Chief Complaint  Patient presents with  . Sore Throat   Subjective:    Sore Throat  This is a new problem. The current episode started in the past 7 days (Sunday evening). The problem has been gradually worsening (Worse in the morning when he gets up and at night when he lies down). Sore throat worse side: Both sides. There has been no fever (but felt hot last night). The pain is at a severity of 6/10 (Specially with the cough). The pain is moderate. Associated symptoms include congestion, coughing ("phlegmy" in the morning and dry during day; did not sleep well last night because of cough), headaches, a hoarse voice, neck pain, swollen glands and trouble swallowing (Cold liquids). Pertinent negatives include no abdominal pain, ear discharge, ear pain, plugged ear sensation, shortness of breath or vomiting. He has had exposure to strep. Exposure to: a few guys at work and some nieces and nephews. He has tried nothing for the symptoms.       Allergies  Allergen Reactions  . 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 Sulfa Antibiotics    Previous Medications   DILTIAZEM (CARDIZEM) 30 MG TABLET    Take 1 tablet (30 mg total) by mouth daily.   LISINOPRIL (PRINIVIL,ZESTRIL) 40 MG TABLET    Take 1 tablet (40 mg total) by mouth daily.   NAPROXEN DR 500 MG EC TABLET    Take 1 tablet (500 mg total) by mouth 2 (two) times daily as needed.   RANITIDINE (ZANTAC) 300 MG TABLET    Take 1 tablet by mouth as needed.    Review of Systems  Constitutional: Negative for fever, chills and fatigue.  HENT: Positive for congestion, hoarse voice,  postnasal drip, rhinorrhea, sneezing, sore throat and trouble swallowing (Cold liquids). Negative for ear discharge, ear pain and sinus pressure.   Respiratory: Positive for cough ("phlegmy" in the morning and dry during day; did not sleep well last night because of cough). Negative for chest tightness, shortness of breath and wheezing.   Cardiovascular: Negative for chest pain, palpitations and leg swelling.  Gastrointestinal: Negative for nausea, vomiting and abdominal pain.  Musculoskeletal: Positive for neck pain. Negative for neck stiffness.  Neurological: Positive for headaches. Negative for dizziness.    Social History  Substance Use Topics  . Smoking status: Former Games developermoker  . Smokeless tobacco: Not on file  . Alcohol Use: No   Objective:   BP 148/80 mmHg  Pulse 64  Temp(Src) 98 F (36.7 C) (Oral)  Resp 16  Wt 216 lb 6.4 oz (98.158 kg)  Physical Exam  Constitutional: He appears well-developed and well-nourished. No distress.  HENT:  Head: Normocephalic and atraumatic.  Right Ear: Hearing, tympanic membrane, external ear and ear canal normal. Tympanic membrane is not erythematous and not bulging. No middle ear effusion.  Left Ear: Hearing, external ear and ear canal normal. Tympanic membrane is not erythematous and not bulging. A middle ear effusion is present.  Nose: Mucosal edema present. No rhinorrhea. Right sinus exhibits no maxillary sinus tenderness and no frontal sinus tenderness. Left sinus exhibits no maxillary  sinus tenderness and no frontal sinus tenderness.  Mouth/Throat: Uvula is midline and mucous membranes are normal. Oropharyngeal exudate (left side) and posterior oropharyngeal erythema present. No posterior oropharyngeal edema.  Eyes: Conjunctivae and EOM are normal. Pupils are equal, round, and reactive to light. Right eye exhibits no discharge. Left eye exhibits no discharge.  Neck: Normal range of motion. Neck supple. No tracheal deviation present. No  Brudzinski's sign and no Kernig's sign noted. No thyromegaly present.  Cardiovascular: Normal rate, regular rhythm and normal heart sounds.  Exam reveals no gallop and no friction rub.   No murmur heard. Pulmonary/Chest: Effort normal and breath sounds normal. No stridor. No respiratory distress. He has no wheezes. He has no rales.  Lymphadenopathy:    He has no cervical adenopathy.  Skin: Skin is warm and dry. He is not diaphoretic.  Vitals reviewed.       Assessment & Plan:     1. Strep throat Strep test was negative but not a good sample obtained because of patient's gag reflex. - POCT rapid strep A  2. Acute bacterial pharyngitis Will treat with amoxicillin as below for bacterial pharyngitis. I do feel it is bacterial secondary to sick contacts and exudate present. Advised he may take tylenol for fevers and body aches. He needs to stay well hydrated. May use chloraseptic spray or cough drops for pain and cough during the day. Hycodan cough syrup given for nighttime cough. He is to call the office if symptoms persist or worsen. - amoxicillin (AMOXIL) 875 MG tablet; Take 1 tablet (875 mg total) by mouth 2 (two) times daily.  Dispense: 20 tablet; Refill: 0  3. Primary osteoarthritis of left knee Stable. Diagnosis pulled for medication refill. Continue current medical treatment plan. - naproxen (NAPROXEN DR) 500 MG EC tablet; Take 1 tablet (500 mg total) by mouth 2 (two) times daily as needed.  Dispense: 60 tablet; Refill: 6  4. Patellofemoral arthralgia of left knee Stable. Diagnosis pulled for medication refill. Continue current medical treatment plan. - naproxen (NAPROXEN DR) 500 MG EC tablet; Take 1 tablet (500 mg total) by mouth 2 (two) times daily as needed.  Dispense: 60 tablet; Refill: 6  5. Cough See above medical treatment for #2. - HYDROcodone-homatropine (HYCODAN) 5-1.5 MG/5ML syrup; Take 5 mLs by mouth every 8 (eight) hours as needed.  Dispense: 180 mL; Refill: 0        Margaretann Loveless, PA-C  [facility]

## 2015-11-22 ENCOUNTER — Ambulatory Visit: Payer: BLUE CROSS/BLUE SHIELD

## 2015-11-24 ENCOUNTER — Ambulatory Visit (INDEPENDENT_AMBULATORY_CARE_PROVIDER_SITE_OTHER): Payer: BLUE CROSS/BLUE SHIELD | Admitting: Physician Assistant

## 2015-11-24 ENCOUNTER — Encounter: Payer: Self-pay | Admitting: Physician Assistant

## 2015-11-24 VITALS — BP 142/80 | HR 60 | Temp 97.7°F | Resp 16 | Wt 214.0 lb

## 2015-11-24 DIAGNOSIS — E669 Obesity, unspecified: Secondary | ICD-10-CM

## 2015-11-24 DIAGNOSIS — Z713 Dietary counseling and surveillance: Secondary | ICD-10-CM

## 2015-11-24 DIAGNOSIS — I10 Essential (primary) hypertension: Secondary | ICD-10-CM

## 2015-11-24 DIAGNOSIS — Z6833 Body mass index (BMI) 33.0-33.9, adult: Secondary | ICD-10-CM

## 2015-11-24 MED ORDER — PHENTERMINE HCL 15 MG PO CAPS: 15.0000 mg | ORAL_CAPSULE | ORAL | Status: DC

## 2015-11-24 NOTE — Patient Instructions (Signed)
Hypertension Hypertension, commonly called high blood pressure, is when the force of blood pumping through your arteries is too strong. Your arteries are the blood vessels that carry blood from your heart throughout your body. A blood pressure reading consists of a higher number over a lower number, such as 110/72. The higher number (systolic) is the pressure inside your arteries when your heart pumps. The lower number (diastolic) is the pressure inside your arteries when your heart relaxes. Ideally you want your blood pressure below 120/80. Hypertension forces your heart to work harder to pump blood. Your arteries may become narrow or stiff. Having untreated or uncontrolled hypertension can cause heart attack, stroke, kidney disease, and other problems. RISK FACTORS Some risk factors for high blood pressure are controllable. Others are not.  Risk factors you cannot control include:   Race. You may be at higher risk if you are African American.  Age. Risk increases with age.  Gender. Men are at higher risk than women before age 45 years. After age 65, women are at higher risk than men. Risk factors you can control include:  Not getting enough exercise or physical activity.  Being overweight.  Getting too much fat, sugar, calories, or salt in your diet.  Drinking too much alcohol. SIGNS AND SYMPTOMS Hypertension does not usually cause signs or symptoms. Extremely high blood pressure (hypertensive crisis) may cause headache, anxiety, shortness of breath, and nosebleed. DIAGNOSIS To check if you have hypertension, your health care provider will measure your blood pressure while you are seated, with your arm held at the level of your heart. It should be measured at least twice using the same arm. Certain conditions can cause a difference in blood pressure between your right and left arms. A blood pressure reading that is higher than normal on one occasion does not mean that you need treatment. If  it is not clear whether you have high blood pressure, you may be asked to return on a different day to have your blood pressure checked again. Or, you may be asked to monitor your blood pressure at home for 1 or more weeks. TREATMENT Treating high blood pressure includes making lifestyle changes and possibly taking medicine. Living a healthy lifestyle can help lower high blood pressure. You may need to change some of your habits. Lifestyle changes may include:  Following the DASH diet. This diet is high in fruits, vegetables, and whole grains. It is low in salt, red meat, and added sugars.  Keep your sodium intake below 2,300 mg per day.  Getting at least 30-45 minutes of aerobic exercise at least 4 times per week.  Losing weight if necessary.  Not smoking.  Limiting alcoholic beverages.  Learning ways to reduce stress. Your health care provider may prescribe medicine if lifestyle changes are not enough to get your blood pressure under control, and if one of the following is true:  You are 18-59 years of age and your systolic blood pressure is above 140.  You are 60 years of age or older, and your systolic blood pressure is above 150.  Your diastolic blood pressure is above 90.  You have diabetes, and your systolic blood pressure is over 140 or your diastolic blood pressure is over 90.  You have kidney disease and your blood pressure is above 140/90.  You have heart disease and your blood pressure is above 140/90. Your personal target blood pressure may vary depending on your medical conditions, your age, and other factors. HOME CARE INSTRUCTIONS    Have your blood pressure rechecked as directed by your health care provider.   Take medicines only as directed by your health care provider. Follow the directions carefully. Blood pressure medicines must be taken as prescribed. The medicine does not work as well when you skip doses. Skipping doses also puts you at risk for  problems.  Do not smoke.   Monitor your blood pressure at home as directed by your health care provider. SEEK MEDICAL CARE IF:   You think you are having a reaction to medicines taken.  You have recurrent headaches or feel dizzy.  You have swelling in your ankles.  You have trouble with your vision. SEEK IMMEDIATE MEDICAL CARE IF:  You develop a severe headache or confusion.  You have unusual weakness, numbness, or feel faint.  You have severe chest or abdominal pain.  You vomit repeatedly.  You have trouble breathing. MAKE SURE YOU:   Understand these instructions.  Will watch your condition.  Will get help right away if you are not doing well or get worse.   This information is not intended to replace advice given to you by your health care provider. Make sure you discuss any questions you have with your health care provider.   Document Released: 07/15/2005 Document Revised: 11/29/2014 Document Reviewed: 05/07/2013 Elsevier Interactive Patient Education 2016 Elsevier Inc.  

## 2015-11-24 NOTE — Progress Notes (Signed)
Patient: Maurice Lee Male    DOB: 04/13/1961   55 y.o.   MRN: [id number] Visit Date: 11/24/2015  Today's Provider: Margaretann Loveless, PA-C   Chief Complaint  Patient presents with  . Follow-up    Hypertension   Subjective:    HPI  Hypertension, follow-up:  BP Readings from Last 3 Encounters:  11/24/15 142/80  11/15/15 148/80  11/02/15 128/61    He was last seen for hypertension 4 weeks ago.  BP at that visit was 148/80. Management since that visit includes Diltiazem was added. He reports excellent compliance with treatment. He is not having side effects.  He is exercising. He is adherent to low salt diet.   Outside blood pressures are n/a. He is experiencing none.  Patient denies chest pain, chest pressure/discomfort, fatigue, irregular heart beat, lower extremity edema and palpitations.     Weight trend: stable Wt Readings from Last 3 Encounters:  11/24/15 214 lb (97.07 kg)  11/15/15 216 lb 6.4 oz (98.158 kg)  11/02/15 214 lb (97.07 kg)    Current diet: well balanced Trying.  ------------------------------------------------------------------------      Allergies  Allergen Reactions  . 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 Sulfa Antibiotics    Previous Medications   AMOXICILLIN (AMOXIL) 875 MG TABLET    Take 1 tablet (875 mg total) by mouth 2 (two) times daily.   DILTIAZEM (CARDIZEM) 30 MG TABLET    Take 1 tablet (30 mg total) by mouth daily.   HYDROCODONE-HOMATROPINE (HYCODAN) 5-1.5 MG/5ML SYRUP    Take 5 mLs by mouth every 8 (eight) hours as needed.   LISINOPRIL (PRINIVIL,ZESTRIL) 40 MG TABLET    Take 1 tablet (40 mg total) by mouth daily.   NAPROXEN (NAPROXEN DR) 500 MG EC TABLET    Take 1 tablet (500 mg total) by mouth 2 (two)  times daily as needed.   RANITIDINE (ZANTAC) 300 MG TABLET    Take 1 tablet by mouth as needed.    Review of Systems  Constitutional: Negative.   Respiratory: Negative for cough, chest tightness and shortness of breath.   Cardiovascular: Negative for chest pain, palpitations and leg swelling.  Gastrointestinal: Negative for nausea, vomiting and abdominal pain.  Endocrine: Negative for cold intolerance and heat intolerance.  Neurological: Negative for dizziness, light-headedness and headaches.  Psychiatric/Behavioral: Negative.     Social History  Substance Use Topics  . Smoking status: Former Games developer  . Smokeless tobacco: Not on file  . Alcohol Use: No   Objective:   BP 142/80 mmHg  Pulse 60  Temp(Src) 97.7 F (36.5 C) (Oral)  Resp 16  Wt 214 lb (97.07 kg)  Physical Exam  Constitutional: He appears well-developed and well-nourished. No distress.  HENT:  Head: Normocephalic and atraumatic.  Neck: Normal range of motion. Neck supple. No JVD present.  Cardiovascular: Normal rate, regular rhythm and normal heart sounds.  Exam reveals no gallop and no friction rub.   No murmur heard. Pulmonary/Chest: Effort normal and breath sounds normal. No respiratory distress. He has no wheezes. He has no rales.  Skin: He is not diaphoretic.  Psychiatric: He has a normal mood and affect. His behavior is normal. Judgment and thought content normal.  Vitals reviewed.       Assessment &  Plan:     1. Essential (primary) hypertension Stable. Continue current medical treatment plan. Will recheck in 6 months at next visit for CPE and recheck labs. He is to call if he has any acute issues in the meantime.  2. Encounter for weight loss counseling He has been trying to lose weight with dieting and exercise over the last 4 months without much success. He is to continue current regime and I will add low-dose phentermine as below, to hopefully not affect his blood pressure, for appetite suppression.  I will see him back in 4 weeks for a weight recheck. - phentermine 15 MG capsule; Take 1 capsule (15 mg total) by mouth every morning.  Dispense: 30 capsule; Refill: 0  3. Obese See above medical treatment plan. - phentermine 15 MG capsule; Take 1 capsule (15 mg total) by mouth every morning.  Dispense: 30 capsule; Refill: 0  4. BMI 33.0-33.9,adult See above medical treatment plan.       Margaretann LovelessJennifer M Burnette, PA-C  [facility]

## 2015-11-30 ENCOUNTER — Ambulatory Visit: Payer: BLUE CROSS/BLUE SHIELD

## 2015-12-20 ENCOUNTER — Ambulatory Visit: Payer: BLUE CROSS/BLUE SHIELD

## 2015-12-21 ENCOUNTER — Telehealth: Payer: Self-pay | Admitting: Urology

## 2015-12-21 NOTE — Telephone Encounter (Signed)
Patient's wife called the office today.  They have cancelled the follow up appt because they have not had the MRI yet.    They are having trouble coming up with the money and getting it scheduled.  They will call back when it is rescheduled.

## 2015-12-22 ENCOUNTER — Ambulatory Visit (INDEPENDENT_AMBULATORY_CARE_PROVIDER_SITE_OTHER): Payer: BLUE CROSS/BLUE SHIELD | Admitting: Physician Assistant

## 2015-12-22 ENCOUNTER — Encounter: Payer: Self-pay | Admitting: Physician Assistant

## 2015-12-22 VITALS — BP 130/80 | HR 76 | Temp 97.2°F | Resp 16 | Wt 209.2 lb

## 2015-12-22 DIAGNOSIS — Z713 Dietary counseling and surveillance: Secondary | ICD-10-CM

## 2015-12-22 DIAGNOSIS — Z6832 Body mass index (BMI) 32.0-32.9, adult: Secondary | ICD-10-CM

## 2015-12-22 DIAGNOSIS — E669 Obesity, unspecified: Secondary | ICD-10-CM | POA: Diagnosis not present

## 2015-12-22 MED ORDER — PHENTERMINE HCL 30 MG PO CAPS
30.0000 mg | ORAL_CAPSULE | ORAL | Status: DC
Start: 2015-12-22 — End: 2016-01-19

## 2015-12-22 NOTE — Progress Notes (Signed)
Patient: Maurice Lee Male    DOB: 07/05/1961   55 y.o.   MRN: [id number] Visit Date: 12/22/2015  Today's Provider: Margaretann Loveless, PA-C   Chief Complaint  Patient presents with  . Follow-up    Weight Loss Counseling   Subjective:    HPI Patient is here for his weight loss counseling. He was put on Phentermine  4 weeks ago. He reports no side effect from the phentermine. Blood pressure has remained stable.  Current Exercise Habits: The patient has a physically strenous job, but has no regular exercise apart from work. Exercise limited by: None identified Wt Readings from Last 3 Encounters:  12/22/15 209 lb 3.2 oz (94.892 kg)  11/24/15 214 lb (97.07 kg)  11/15/15 216 lb 6.4 oz (98.158 kg)      Allergies  Allergen Reactions  . 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 Sulfa Antibiotics    Previous Medications   DILTIAZEM (CARDIZEM) 30 MG TABLET    Take 1 tablet (30 mg total) by mouth daily.   LISINOPRIL (PRINIVIL,ZESTRIL) 40 MG TABLET    Take 1 tablet (40 mg total) by mouth daily.   NAPROXEN (NAPROXEN DR) 500 MG EC TABLET    Take 1 tablet (500 mg total) by mouth 2 (two) times daily as needed.   PHENTERMINE 15 MG CAPSULE    Take 1 capsule (15 mg total) by mouth every morning.   RANITIDINE (ZANTAC) 300 MG TABLET    Take 1 tablet by mouth as needed.    Review of Systems  Constitutional: Negative for fatigue.  Respiratory: Negative for cough, chest tightness and shortness of breath.   Cardiovascular: Negative for chest pain, palpitations and leg swelling.  Gastrointestinal: Negative for nausea and abdominal pain.  Psychiatric/Behavioral: Negative.     Social History  Substance Use Topics  . Smoking status: Former Games developer  . Smokeless tobacco: Not on  file  . Alcohol Use: No   Objective:   BP 130/80 mmHg  Pulse 76  Temp(Src) 97.2 F (36.2 C) (Oral)  Resp 16  Wt 209 lb 3.2 oz (94.892 kg)  Physical Exam  Constitutional: He appears well-developed and well-nourished. No distress.  HENT:  Head: Normocephalic and atraumatic.  Neck: Normal range of motion. Neck supple.  Cardiovascular: Normal rate, regular rhythm and normal heart sounds.  Exam reveals no gallop and no friction rub.   No murmur heard. Pulmonary/Chest: Effort normal and breath sounds normal. No respiratory distress. He has no wheezes. He has no rales.  Skin: He is not diaphoretic.  Psychiatric: He has a normal mood and affect. His behavior is normal. Judgment and thought content normal.  Vitals reviewed.       Assessment & Plan:     1. Encounter for weight loss counseling He is doing well with trying to change his diet. Discussed doing a food diary to make sure that he is eating appropriately. He does have a strenuous job and uses status is exercise. I will give him phentermine 30 mg capsule for appetite suppression. He is to call the office if he has any adverse reaction to the increase. I will see him back in 4 weeks for his weight recheck. - phentermine 30 MG capsule; Take 1 capsule (30 mg total) by mouth  every morning.  Dispense: 30 capsule; Refill: 0  2. Obesity See above medical treatment plan. - phentermine 30 MG capsule; Take 1 capsule (30 mg total) by mouth every morning.  Dispense: 30 capsule; Refill: 0  3. BMI 32.0-32.9,adult See above medical treatment plan.       Margaretann LovelessJennifer M Brayden Brodhead, PA-C  [facility]

## 2015-12-22 NOTE — Patient Instructions (Signed)

## 2015-12-27 ENCOUNTER — Other Ambulatory Visit: Payer: Self-pay | Admitting: Physician Assistant

## 2015-12-27 ENCOUNTER — Ambulatory Visit: Payer: BLUE CROSS/BLUE SHIELD | Admitting: Urology

## 2015-12-27 DIAGNOSIS — I10 Essential (primary) hypertension: Secondary | ICD-10-CM

## 2015-12-27 MED ORDER — DILTIAZEM HCL 30 MG PO TABS: 30.0000 mg | ORAL_TABLET | Freq: Every day | ORAL | Status: DC

## 2016-01-19 ENCOUNTER — Encounter: Payer: Self-pay | Admitting: Physician Assistant

## 2016-01-19 ENCOUNTER — Ambulatory Visit (INDEPENDENT_AMBULATORY_CARE_PROVIDER_SITE_OTHER): Payer: BLUE CROSS/BLUE SHIELD | Admitting: Physician Assistant

## 2016-01-19 VITALS — BP 134/78 | HR 70 | Temp 97.7°F | Resp 16 | Wt 205.0 lb

## 2016-01-19 DIAGNOSIS — E669 Obesity, unspecified: Secondary | ICD-10-CM

## 2016-01-19 DIAGNOSIS — Z713 Dietary counseling and surveillance: Secondary | ICD-10-CM

## 2016-01-19 DIAGNOSIS — B351 Tinea unguium: Secondary | ICD-10-CM | POA: Diagnosis not present

## 2016-01-19 MED ORDER — PHENTERMINE HCL 30 MG PO CAPS
30.0000 mg | ORAL_CAPSULE | ORAL | Status: DC
Start: 2016-01-19 — End: 2016-02-16

## 2016-01-19 NOTE — Patient Instructions (Signed)

## 2016-01-19 NOTE — Progress Notes (Signed)
Patient: Maurice Lee Male    DOB: 1960-09-29   55 y.o.   MRN: [id number] Visit Date: 01/19/2016  Today's Provider: Margaretann LovelessJennifer M Treyvonne Tata, PA-C   Chief Complaint  Patient presents with  . Follow-up    obesity   Subjective:    HPI Patient is here for follow-up obesity. He is on phentermine. Last office visit he was put on phentermine 30 mg. He reports no side effect from the medication. Current Exercise Habits: The patient has a physically strenuous job.He is trying to eat a healthier diet and decrease portion size.  Wt Readings from Last 3 Encounters:  01/19/16 205 lb (92.987 kg)  12/22/15 209 lb 3.2 oz (94.892 kg)  11/24/15 214 lb (97.07 kg)   His blood pressure is also improving with the lisinopril 40 mg, diltiazem 30 mg and lifestyle changes.  BP Readings from Last 3 Encounters:  01/19/16 134/78  12/22/15 130/80  11/24/15 142/80      Allergies  Allergen Reactions  . 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 Sulfa Antibiotics    Current Meds  Medication Sig  . diltiazem (CARDIZEM) 30 MG tablet Take 1 tablet (30 mg total) by mouth daily.  Marland Kitchen. lisinopril (PRINIVIL,ZESTRIL) 40 MG tablet Take 1 tablet (40 mg total) by mouth daily.  . naproxen (NAPROXEN DR) 500 MG EC tablet Take 1 tablet (500 mg total) by mouth 2 (two) times daily as needed.  . phentermine 30 MG capsule Take 1 capsule (30 mg total) by mouth every morning.  . ranitidine (ZANTAC) 300 MG tablet Take 1 tablet by mouth as needed.    Review of Systems  Constitutional: Negative.   Respiratory: Negative.   Cardiovascular: Negative.   Gastrointestinal: Negative.   Psychiatric/Behavioral: Negative.     Social History  Substance Use Topics  . Smoking status: Former Games developermoker  . Smokeless tobacco:  Not on file  . Alcohol Use: No   Objective:   BP 134/78 mmHg  Pulse 70  Temp(Src) 97.7 F (36.5 C) (Oral)  Resp 16  Wt 205 lb (92.987 kg)  Physical Exam  Constitutional: He appears well-developed and well-nourished. No distress.  HENT:  Head: Normocephalic and atraumatic.  Cardiovascular: Normal rate, regular rhythm and normal heart sounds.  Exam reveals no gallop and no friction rub.   No murmur heard. Pulmonary/Chest: Effort normal and breath sounds normal. No respiratory distress. He has no wheezes. He has no rales.  Skin: He is not diaphoretic.  Vitals reviewed.     Assessment & Plan:     1. Encounter for weight loss counseling He is doing well and has continued to lose weight. I will continue phentermine as below as he is tolerating this well. He is to continue to try to decrease portion size and make sure to be eating healthier food options. I did encourage more physical activity outside of work and he voices trying to do this also. I will see him back in 4 weeks to recheck his weight and make sure his blood pressure is staying stable. - phentermine 30 MG capsule; Take 1 capsule (30 mg total) by mouth every morning.  Dispense: 30 capsule; Refill: 0  2. Obesity See above medical treatment plan. - phentermine 30 MG capsule; Take 1 capsule (30 mg total)  by mouth every morning.  Dispense: 30 capsule; Refill: 0  3. Onychomycosis of left great toe He does have onychomycosis of the left great toe that is causing the toe to lift from the nailbed. He has had this toenail removed previously for the same issue. He has previously been seen by Dr. Alberteen Spindleline many years ago and is requesting a referral back for treatment/removal of this nail. I have placed the referral as below and we will help facilitate this. - Ambulatory referral to Podiatry       Margaretann LovelessJennifer M Kree Armato, PA-C  [facility]

## 2016-02-16 ENCOUNTER — Encounter: Payer: Self-pay | Admitting: Physician Assistant

## 2016-02-16 ENCOUNTER — Ambulatory Visit (INDEPENDENT_AMBULATORY_CARE_PROVIDER_SITE_OTHER): Payer: BLUE CROSS/BLUE SHIELD | Admitting: Physician Assistant

## 2016-02-16 VITALS — BP 110/60 | HR 70 | Temp 97.2°F | Resp 16 | Wt 200.2 lb

## 2016-02-16 DIAGNOSIS — I10 Essential (primary) hypertension: Secondary | ICD-10-CM | POA: Diagnosis not present

## 2016-02-16 DIAGNOSIS — E669 Obesity, unspecified: Secondary | ICD-10-CM

## 2016-02-16 DIAGNOSIS — Z713 Dietary counseling and surveillance: Secondary | ICD-10-CM | POA: Diagnosis not present

## 2016-02-16 MED ORDER — PHENTERMINE HCL 30 MG PO CAPS
30.0000 mg | ORAL_CAPSULE | ORAL | Status: DC
Start: 2016-02-16 — End: 2016-03-12

## 2016-02-16 NOTE — Patient Instructions (Signed)

## 2016-02-16 NOTE — Progress Notes (Addendum)
       Patient: Maurice Lee Vandenboom Male    DOB: [birth year]   55 y.o.   MRN: [id number] Visit Date: 02/16/2016  Today's Provider: Margaretann LovelessJennifer M Burnette, PA-C   Chief Complaint  Patient presents with  . Follow-up    Weight loss counseling   Subjective:    HPI Patient is here to follow-up on obesity. Last office visit his weight was 205 lbs and today his weight is 200.2 lbs.  Current Exercise Habits: The patient has a physically strenuous job.He is trying to eat a healthier diet and decrease portion size. His blood pressure today is 110/60.  BP Readings from Last 3 Encounters:  02/16/16 110/60  01/19/16 134/78  12/22/15 130/80      Allergies  Allergen Reactions  . 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 Sulfa Antibiotics    Current Meds  Medication Sig  . diltiazem (CARDIZEM) 30 MG tablet Take 1 tablet (30 mg total) by mouth daily.  Marland Kitchen. lisinopril (PRINIVIL,ZESTRIL) 40 MG tablet Take 1 tablet (40 mg total) by mouth daily.  . naproxen (NAPROXEN DR) 500 MG EC tablet Take 1 tablet (500 mg total) by mouth 2 (two) times daily as needed.  . phentermine 30 MG capsule Take 1 capsule (30 mg total) by mouth every morning.  . ranitidine (ZANTAC) 300 MG tablet Take 1 tablet by mouth as needed.  . terbinafine (LAMISIL) 250 MG tablet Take by mouth.    Review of Systems  Constitutional: Negative.   Respiratory: Negative.   Cardiovascular: Negative.   Gastrointestinal: Negative.   Psychiatric/Behavioral: Negative.     Social History  Substance Use Topics  . Smoking status: Former Games developermoker  . Smokeless tobacco: Not on file  . Alcohol Use: No   Objective:   BP 110/60 mmHg  Pulse 70  Temp(Src) 97.2 F (36.2 C) (Oral)  Resp 16  Wt 200 lb 3.2 oz (90.81 kg)  Physical Exam    Constitutional: He appears well-developed and well-nourished. No distress.  HENT:  Head: Normocephalic and atraumatic.  Neck: Normal range of motion. Neck supple.  Cardiovascular: Normal rate, regular rhythm and normal heart sounds.  Exam reveals no gallop and no friction rub.   No murmur heard. Pulmonary/Chest: Effort normal and breath sounds normal. No respiratory distress. He has no wheezes. He has no rales.  Musculoskeletal: He exhibits no edema.  Skin: He is not diaphoretic.  Psychiatric: He has a normal mood and affect. His behavior is normal. Judgment and thought content normal.  Vitals reviewed.     Assessment & Plan:     1. Encounter for weight loss counseling Lost 5 pounds over last month. He is to continue phentermine for appetite suppression. Continue appropriate portion size. Try to make healthier choices. I will see him back in 3 months to recheck. - phentermine 30 MG capsule; Take 1 capsule (30 mg total) by mouth every morning.  Dispense: 30 capsule; Refill: 0  2. Obesity See above medical treatment plan. - phentermine 30 MG capsule; Take 1 capsule (30 mg total) by mouth every morning.  Dispense: 30 capsule; Refill: 0  3. Essential (primary) hypertension Stable. Continue current medical treatment plan.       Margaretann LovelessJennifer M Burnette, PA-C  [facility]

## 2016-03-12 ENCOUNTER — Telehealth: Payer: Self-pay | Admitting: Physician Assistant

## 2016-03-12 DIAGNOSIS — E663 Overweight: Secondary | ICD-10-CM

## 2016-03-12 DIAGNOSIS — Z713 Dietary counseling and surveillance: Secondary | ICD-10-CM

## 2016-03-12 DIAGNOSIS — E669 Obesity, unspecified: Secondary | ICD-10-CM

## 2016-03-12 DIAGNOSIS — Z6831 Body mass index (BMI) 31.0-31.9, adult: Secondary | ICD-10-CM

## 2016-03-12 MED ORDER — PHENTERMINE HCL 37.5 MG PO TABS
37.5000 mg | ORAL_TABLET | Freq: Every day | ORAL | 0 refills | Status: DC
Start: 2016-03-12 — End: 2016-03-20

## 2016-03-12 NOTE — Telephone Encounter (Signed)
Pt's wife called about her husband diet pill nad increasing the dosage.  Please call her back at [phone]  Thanks. Barth Kirkseri

## 2016-03-12 NOTE — Telephone Encounter (Signed)
Patient's wife wants Phentermine to be increase . She reports that he is snacking a lot.    Thanks,  -Kelliann Pendergraph

## 2016-03-12 NOTE — Telephone Encounter (Signed)
Advised patient as directed below.  Thanks,  -Jenese Mischke

## 2016-03-12 NOTE — Telephone Encounter (Signed)
Dose increase printed. May not be able to fill until next week due to insurance and it being early. May pick up at front desk.

## 2016-03-19 ENCOUNTER — Telehealth: Payer: Self-pay

## 2016-03-19 NOTE — Telephone Encounter (Signed)
Can see if he wants to be squeezed in at 815 or 1045 tomorrow

## 2016-03-19 NOTE — Telephone Encounter (Signed)
Please advise.  Thanks,  -Joseline 

## 2016-03-19 NOTE — Telephone Encounter (Signed)
LMTCB

## 2016-03-19 NOTE — Telephone Encounter (Signed)
[name] called to report that Maurice Lee's Blood pressure has been elevated.  She said it runs around 170/100.  He is not having any symptoms other than fatigue.  [name] said he has not started the increased dose of phentermine yet.    Thanks,   -Vernona RiegerLaura

## 2016-03-20 ENCOUNTER — Ambulatory Visit (INDEPENDENT_AMBULATORY_CARE_PROVIDER_SITE_OTHER): Payer: BLUE CROSS/BLUE SHIELD | Admitting: Physician Assistant

## 2016-03-20 ENCOUNTER — Encounter: Payer: Self-pay | Admitting: Physician Assistant

## 2016-03-20 VITALS — BP 142/64 | HR 84 | Temp 98.0°F | Wt 204.0 lb

## 2016-03-20 DIAGNOSIS — R55 Syncope and collapse: Secondary | ICD-10-CM

## 2016-03-20 DIAGNOSIS — I10 Essential (primary) hypertension: Secondary | ICD-10-CM | POA: Diagnosis not present

## 2016-03-20 LAB — POCT GLYCOSYLATED HEMOGLOBIN (HGB A1C)
Est. average glucose Bld gHb Est-mCnc: 114
Hemoglobin A1C: 5.6

## 2016-03-20 MED ORDER — DILTIAZEM HCL ER 120 MG PO CP24: 120.0000 mg | ORAL_CAPSULE | Freq: Every day | ORAL | 1 refills | Status: DC

## 2016-03-20 NOTE — Telephone Encounter (Signed)
Patient is scheduled for today at 10:45.  Thanks,  -Daune Divirgilio

## 2016-03-20 NOTE — Progress Notes (Signed)
Patient: Maurice SchoolsHoward Grosshans Male    DOB: [birth year]   55 y.o.   MRN: [id number] Visit Date: 03/20/2016  Today's Provider: Margaretann LovelessJennifer M Burnette, PA-C   Chief Complaint  Patient presents with  . Hypertension  . Follow-up   Subjective:    HPI  Hypertension, follow-up:  BP Readings from Last 3 Encounters:  03/20/16 (!) 142/64  12/29/13 140/63  02/16/16 110/60    He was last seen for hypertension 1 months ago.  BP at that visit was 110/60. Management changes since that visit include continue medications. He reports good compliance with treatment. He is not having side effects.  He is exercising patient has a strenuous job. He is adherent to low salt diet.   Outside blood pressures are being checked. Since Friday blood pressures have been elevated average range is 170-180's/80-100's. Also today in the office his BP was rechecked and was 164/70.  He is experiencing cold sweats on Friday. On Friday he had an episode of generalized weakness with cold sweat and syncopal prodrome. He denies any chest pain, SOB, or dizziness with this episode. He did not pass out, just states he had to lie down on the cold floor for approx 30 minutes before he could stand up to work. 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 He does report not having much to eat before. He had just eaten his supper at work when this episode happened.  Patient denies none.   Cardiovascular risk factors include hypertension, male gender and obesity (BMI >= 30 kg/m2).  Use of agents associated with hypertension: none.     Weight trend: stable Wt Readings from Last 3 Encounters:  03/20/16 204 lb (92.5 kg)  12/29/13 239 lb (108.4 kg)  02/16/16 200 lb 3.2 oz (90.8 kg)   ------------------------------------------------------------------------    Previous Medications   DILTIAZEM (CARDIZEM) 30 MG TABLET    Take 1 tablet (30 mg total) by mouth daily.   LISINOPRIL (PRINIVIL,ZESTRIL) 40 MG TABLET     Take 1 tablet (40 mg total) by mouth daily.   NAPROXEN (NAPROXEN DR) 500 MG EC TABLET    Take 1 tablet (500 mg total) by mouth 2 (two) times daily as needed.   PHENTERMINE (ADIPEX-P) 37.5 MG TABLET    Take 1 tablet (37.5 mg total) by mouth daily before breakfast.   RANITIDINE (ZANTAC) 300 MG TABLET    Take 1 tablet by mouth as needed.   TERBINAFINE (LAMISIL) 250 MG TABLET    Take by mouth.    Review of Systems  Constitutional: Negative.   Respiratory: Negative.   Cardiovascular: Negative.   Neurological: Negative.   Psychiatric/Behavioral: Negative.     Social History  Substance Use Topics  . Smoking status: Former Games developermoker  . Smokeless tobacco: Never Used  . Alcohol use No   Objective:   BP (!) 142/64 (BP Location: Right Arm, Patient Position: Sitting, Cuff Size: Large)   Pulse 84   Temp 98 F (36.7 C) (Oral)   Wt 204 lb (92.5 kg)   BMI 31.95 kg/m   Physical Exam  Constitutional: He appears well-developed and well-nourished. No distress.  HENT:  Head: Normocephalic and atraumatic.  Neck: Normal range of motion. Neck supple. No tracheal deviation present. No thyromegaly present.  Cardiovascular: Normal rate, regular rhythm and normal heart sounds.  Exam reveals no gallop and no friction rub.   No murmur heard. Pulmonary/Chest: Effort normal and breath sounds normal. No respiratory distress. He  has no wheezes. He has no rales.  Musculoskeletal: He exhibits no edema.  Lymphadenopathy:    He has no cervical adenopathy.  Skin: He is not diaphoretic.  Psychiatric: He has a normal mood and affect. His behavior is normal. Judgment and thought content normal.  Vitals reviewed.     Assessment & Plan:     1. Near syncope EKG was fairly unremarkable. Not a great read due to issues with the EKG machine in the office. Will also refer to cardiology due to history of the episode on Friday. HgA1c was WNL.  - EKG 12-Lead - POCT HgB A1C - Ambulatory referral to Cardiology  2.  Essential hypertension Increased diltiazem as below. Will recheck BP in 4-6 weeks. - Ambulatory referral to Cardiology - diltiazem (DILACOR XR) 120 MG 24 hr capsule; Take 1 capsule (120 mg total) by mouth daily.  Dispense: 30 capsule; Refill: 1   Follow up: No Follow-up on file.

## 2016-03-20 NOTE — Patient Instructions (Signed)

## 2016-03-22 DIAGNOSIS — R55 Syncope and collapse: Secondary | ICD-10-CM | POA: Insufficient documentation

## 2016-04-08 ENCOUNTER — Other Ambulatory Visit: Payer: Self-pay | Admitting: Physician Assistant

## 2016-04-08 DIAGNOSIS — I10 Essential (primary) hypertension: Secondary | ICD-10-CM

## 2016-04-08 MED ORDER — METOPROLOL SUCCINATE ER 25 MG PO TB24: 25.0000 mg | ORAL_TABLET | Freq: Every day | ORAL | 1 refills | Status: DC

## 2016-04-12 ENCOUNTER — Telehealth: Payer: Self-pay | Admitting: Physician Assistant

## 2016-04-12 NOTE — Telephone Encounter (Signed)
No he does not need to be tested unless he is having burning with urination or penile discharge.   He can hold off on new medication if BP is doing ok until I see him next week.

## 2016-04-12 NOTE — Telephone Encounter (Signed)
Pt wife states she tested positive for e coli and she is asking should pt be tested also.    Pt wife also states pt blood pressure is running around 147/72.  Pt is asking if he should state the new blood pressure medication this week?         [id number]#[phone]/[id number]#[id number]/MW

## 2016-04-12 NOTE — Telephone Encounter (Signed)
Advised patient's wife as below.  

## 2016-04-17 ENCOUNTER — Ambulatory Visit
Admission: RE | Admit: 2016-04-17 | Discharge: 2016-04-17 | Disposition: A | Discharge status: Home / Self Care | Payer: BLUE CROSS/BLUE SHIELD | Source: Ambulatory Visit | Attending: Physician Assistant | Admitting: Physician Assistant

## 2016-04-17 ENCOUNTER — Ambulatory Visit (INDEPENDENT_AMBULATORY_CARE_PROVIDER_SITE_OTHER): Payer: BLUE CROSS/BLUE SHIELD | Admitting: Physician Assistant

## 2016-04-17 ENCOUNTER — Ambulatory Visit
Admission: RE | Admit: 2016-04-17 | Discharge: 2016-04-17 | Disposition: A | Discharge status: Home / Self Care | Payer: BLUE CROSS/BLUE SHIELD | Source: Ambulatory Visit | Attending: Obstetrics and Gynecology | Admitting: Obstetrics and Gynecology

## 2016-04-17 ENCOUNTER — Encounter: Payer: Self-pay | Admitting: Physician Assistant

## 2016-04-17 ENCOUNTER — Telehealth: Payer: Self-pay

## 2016-04-17 VITALS — BP 146/66 | HR 80 | Temp 97.9°F | Resp 16 | Ht 67.0 in | Wt 213.0 lb

## 2016-04-17 DIAGNOSIS — Z1211 Encounter for screening for malignant neoplasm of colon: Secondary | ICD-10-CM | POA: Diagnosis not present

## 2016-04-17 DIAGNOSIS — I10 Essential (primary) hypertension: Secondary | ICD-10-CM | POA: Diagnosis not present

## 2016-04-17 DIAGNOSIS — R109 Unspecified abdominal pain: Secondary | ICD-10-CM

## 2016-04-17 DIAGNOSIS — M546 Pain in thoracic spine: Secondary | ICD-10-CM | POA: Diagnosis present

## 2016-04-17 DIAGNOSIS — M62830 Muscle spasm of back: Secondary | ICD-10-CM

## 2016-04-17 MED ORDER — CYCLOBENZAPRINE HCL 5 MG PO TABS: 5.0000 mg | ORAL_TABLET | Freq: Three times a day (TID) | ORAL | 1 refills | Status: DC | PRN

## 2016-04-17 MED ORDER — DILTIAZEM HCL ER 120 MG PO CP24: 120.0000 mg | ORAL_CAPSULE | Freq: Every day | ORAL | 1 refills | Status: DC

## 2016-04-17 NOTE — Telephone Encounter (Signed)
LMTCB

## 2016-04-17 NOTE — Telephone Encounter (Signed)
-----   Message from Margaretann LovelessJennifer M Burnette, [place]PA-C sent at 04/17/2016  2:15 PM EDT ----- Thoracic spine xray is normal. Most likely musculature in nature. Will send in muscle relaxer to take with ibuprofen for pain.

## 2016-04-17 NOTE — Progress Notes (Signed)
Patient: Maurice Lee Male    DOB: Jul 24, 1961   55 y.o.   MRN: [id number] Visit Date: 04/17/2016  Today's Provider: Margaretann LovelessJennifer M Tameka Hoiland, PA-C   Chief Complaint  Patient presents with  . Hypertension  . Back Pain   Subjective:    HPI  Hypertension, follow-up:  BP Readings from Last 3 Encounters:  04/17/16 (!) 146/66  03/20/16 (!) 142/64  12/29/13 140/63    He was last seen for hypertension 1 months ago.  BP at that visit was 142/64. Management changes since that visit include increase Diltiazem 120 mg daily, continue lisinopril 40mg . He reports he did not start Diltiazem, pt reports he bought a new BP monitor and reading are back to normal. He is not having side effects.  He is exercising. He is adherent to low salt diet.   Outside blood pressures are stable. He is experiencing none.  Patient denies chest pain and lower extremity edema.   Cardiovascular risk factors include hypertension and obesity (BMI >= 30 kg/m2).  Use of agents associated with hypertension: none.   Patient got a new BP cuff for home that is reading more  appropriately. His at home readings range between  161-141/76-67.  Weight trend: increasing steadily Wt Readings from Last 3 Encounters:  04/17/16 213 lb (96.6 kg)  03/20/16 204 lb (92.5 kg)  12/29/13 239 lb (108.4 kg)    Current diet: in general, a "healthy" diet    ------------------------------------------------------------------------  Back pain: Patient c/o lower back pain right in the middle x's 1 week. Patient denies any injuries. Patient reports pain has been going on and off times several years. Patient reports that he takes Ibuprofen PRN and reports moderate improvement. He does have to do a lot of twisting and pulling with heavy metal sheets at work.     Allergies  Allergen Reactions  . 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 Sulfa Antibiotics      Current Outpatient Prescriptions:  .  lisinopril (PRINIVIL,ZESTRIL) 40 MG tablet, Take 1 tablet (40 mg total) by mouth daily., Disp: 90 tablet, Rfl: 3 .  metoprolol succinate (TOPROL-XL) 25 MG 24 hr tablet, Take 1 tablet (25 mg total) by mouth daily., Disp: 30 tablet, Rfl: 1 .  naproxen (NAPROXEN DR) 500 MG EC tablet, Take 1 tablet (500 mg total) by mouth 2 (two) times daily as needed., Disp: 60 tablet, Rfl: 6 .  terbinafine (LAMISIL) 250 MG tablet, Take by mouth., Disp: , Rfl:  .  diltiazem (DILACOR XR) 120 MG 24 hr capsule, Take 1 capsule (120 mg total) by mouth daily. (Patient not taking: Reported on 04/17/2016), Disp: 30 capsule, Rfl: 1 .  ranitidine (ZANTAC) 300 MG tablet, Take 1 tablet by mouth as needed., Disp: , Rfl:   Review of Systems  Constitutional: Negative.   Respiratory: Negative.   Cardiovascular: Negative.   Gastrointestinal: Negative.   Genitourinary: Negative.   Musculoskeletal: Positive for back pain.  Neurological: Negative.   Psychiatric/Behavioral: Negative.     Social History  Substance Use Topics  . Smoking status: Former Games developermoker  . Smokeless tobacco: Never Used  . Alcohol use No   Objective:   BP (!) 146/66 (BP Location: Right Arm, Patient Position: Sitting, Cuff Size: Large)   Pulse 80  Temp 97.9 F (36.6 C) (Oral)   Resp 16   Ht 5\' 7"  (1.702 m)   Wt 213 lb (96.6 kg)   BMI 33.36 kg/m   Physical Exam  Constitutional: He appears well-developed and well-nourished. No distress.  HENT:  Head: Normocephalic and atraumatic.  Neck: Normal range of motion. Neck supple. No JVD present. No tracheal deviation present. No thyromegaly present.  Cardiovascular: Normal rate, regular rhythm and normal heart sounds.  Exam reveals no gallop and no friction rub.   No murmur heard. Pulmonary/Chest: Effort normal and  breath sounds normal. No respiratory distress. He has no wheezes. He has no rales.  Musculoskeletal: He exhibits no edema.       Thoracic back: He exhibits tenderness (right side paraspinal tenderness and spasm) and spasm. He exhibits normal range of motion, no bony tenderness and normal pulse.       Lumbar back: Normal.  Lymphadenopathy:    He has no cervical adenopathy.  Skin: He is not diaphoretic.  Vitals reviewed.     Assessment & Plan:     1. Essential hypertension Stable. Diagnosis pulled for medication refill. Continue current medical treatment plan. Has cardiology referral and appt already in place due to episode he had at work. I will see him back in 3 months or sooner if needed. - diltiazem (DILACOR XR) 120 MG 24 hr capsule; Take 1 capsule (120 mg total) by mouth daily.  Dispense: 90 capsule; Refill: 1  2. Midline thoracic back pain We'll get x-ray to rule out any bony abnormality or arthritis. I do suspect most likely muscle spasm. He is to continue over-the-counter anti-inflammatories as he is doing. I will add Flexeril as below. He is to call if symptoms failed to improve or worsen. - DG Thoracic Spine 2 View; Future - cyclobenzaprine (FLEXERIL) 5 MG tablet; Take 1 tablet (5 mg total) by mouth 3 (three) times daily as needed for muscle spasms.  Dispense: 30 tablet; Refill: 1  3. Colon cancer screening - Cologuard  4. Muscle spasm of back See above medical treatment plan for #2. - cyclobenzaprine (FLEXERIL) 5 MG tablet; Take 1 tablet (5 mg total) by mouth 3 (three) times daily as needed for muscle spasms.  Dispense: 30 tablet; Refill: 1       Margaretann Loveless, PA-C  [facility]

## 2016-04-17 NOTE — Patient Instructions (Signed)

## 2016-04-18 NOTE — Telephone Encounter (Signed)
Patient advised as below. Patient verbalizes understanding and is in agreement with treatment plan.  

## 2016-05-17 ENCOUNTER — Ambulatory Visit (INDEPENDENT_AMBULATORY_CARE_PROVIDER_SITE_OTHER): Payer: BLUE CROSS/BLUE SHIELD | Admitting: Physician Assistant

## 2016-05-17 ENCOUNTER — Encounter: Payer: Self-pay | Admitting: Physician Assistant

## 2016-05-17 VITALS — BP 138/60 | HR 59 | Temp 97.7°F | Resp 16 | Ht 67.0 in | Wt 219.6 lb

## 2016-05-17 DIAGNOSIS — Z13220 Encounter for screening for lipoid disorders: Secondary | ICD-10-CM

## 2016-05-17 DIAGNOSIS — K219 Gastro-esophageal reflux disease without esophagitis: Secondary | ICD-10-CM | POA: Diagnosis not present

## 2016-05-17 DIAGNOSIS — Z833 Family history of diabetes mellitus: Secondary | ICD-10-CM

## 2016-05-17 DIAGNOSIS — I10 Essential (primary) hypertension: Secondary | ICD-10-CM

## 2016-05-17 DIAGNOSIS — Z0000 Encounter for general adult medical examination without abnormal findings: Secondary | ICD-10-CM

## 2016-05-17 DIAGNOSIS — Z136 Encounter for screening for cardiovascular disorders: Secondary | ICD-10-CM

## 2016-05-17 DIAGNOSIS — Z125 Encounter for screening for malignant neoplasm of prostate: Secondary | ICD-10-CM

## 2016-05-17 MED ORDER — RANITIDINE HCL 300 MG PO TABS: 300.0000 mg | ORAL_TABLET | ORAL | 3 refills | Status: DC | PRN

## 2016-05-17 NOTE — Progress Notes (Signed)
Patient: Maurice Lee, Male    DOB: 1960/12/26, 55 y.o.   MRN: [id number] Visit Date: 05/17/2016  Today's Provider: Margaretann LovelessJennifer M Araiya Tilmon, PA-C   Chief Complaint  Patient presents with  . Annual Exam   Subjective:    Annual physical exam Maurice Lee is a 55 y.o. male who presents today for health maintenance and complete physical. He feels fairly well. He reports that his right shoulder and right leg hurts. He reports exercising none. He reports he is sleeping poorly. Sleep issues are most often associated with work.  He does have acute issue with worsening knee pain. He does have OA and states when the weather gets cold he notices the pain and swelling worsens. He is using Aleve with moderate improvement. -----------------------------------------------------------------   Review of Systems  Constitutional: Negative.   Eyes: Negative.   Respiratory: Negative.   Cardiovascular: Negative.   Gastrointestinal: Negative.   Endocrine: Negative.   Genitourinary: Negative.   Musculoskeletal: Positive for arthralgias, back pain, joint swelling and myalgias.  Skin: Negative.   Allergic/Immunologic: Negative.   Neurological: Positive for headaches.  Hematological: Negative.   Psychiatric/Behavioral: Negative.     Social History      He  reports that he has quit smoking. He has never used smokeless tobacco. He reports that he does not drink alcohol or use drugs.       Social History   Social History  . Marital status: Married    Spouse name: N/A  . Number of children: N/A  . Years of education: N/A   Social History Main Topics  . Smoking status: Former Games developermoker  . Smokeless tobacco: Never Used  . Alcohol use No  . Drug use: No  . Sexual activity: Yes   Other Topics Concern  . None   Social History Narrative  . None    Past Medical History:  Diagnosis Date  . Hypertension   . Pseudocholinesterase deficiency      Patient Active Problem List   Diagnosis Date Noted  . Near syncope 03/22/2016  . Left knee pain 08/24/2015  . Calculus of kidney 06/07/2015  . Serum cholinesterase defect (HCC) 04/27/2015  . H/O renal calculi 02/22/2015  . Essential (primary) hypertension 06/21/2014    Past Surgical History:  Procedure Laterality Date  . Kidney stones removed      Family History        Family Status  Relation Status  . Mother   . Father         His family history includes COPD in his mother; Cancer in his father; Congestive Heart Failure in his mother; Lupus in his mother; Thyroid disease in his mother.    Allergies  Allergen Reactions  . 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 Sulfa Antibiotics     No outpatient prescriptions have been marked as taking for the 05/17/16 encounter (Office Visit) with Margaretann LovelessJennifer M Ashaz Robling, PA-C.    Patient Care Team: Margaretann LovelessJennifer M Gracee Ratterree, PA-C as PCP - General (Family Medicine)     Objective:   Vitals: There were no vitals taken for this visit.   Physical Exam  Constitutional: He is oriented to person, place, and time. He appears well-developed and well-nourished.  HENT:  Head: Normocephalic and atraumatic.  Right Ear: Tympanic  membrane, external ear and ear canal normal.  Left Ear: Tympanic membrane, external ear and ear canal normal.  Nose: Nose normal.  Mouth/Throat: Uvula is midline, oropharynx is clear and moist and mucous membranes are normal.  Eyes: Conjunctivae and EOM are normal. Pupils are equal, round, and reactive to light. Right eye exhibits no discharge.  Neck: Normal range of motion. Neck supple. Carotid bruit is not present. No tracheal deviation present. No thyromegaly present.  Cardiovascular: Normal rate, regular rhythm, normal heart sounds and intact  distal pulses.   No murmur heard. Pulmonary/Chest: Effort normal and breath sounds normal. No respiratory distress. He has no wheezes. He has no rales. He exhibits no tenderness.  Abdominal: Soft. He exhibits no distension and no mass. There is no tenderness. There is no rebound and no guarding.  Musculoskeletal: Normal range of motion. He exhibits no edema or tenderness.  Lymphadenopathy:    He has no cervical adenopathy.  Neurological: He is alert and oriented to person, place, and time. He has normal reflexes. No cranial nerve deficit. He exhibits normal muscle tone. Coordination normal.  Skin: Skin is warm and dry. No rash noted. No erythema.  Psychiatric: He has a normal mood and affect. His behavior is normal. Judgment and thought content normal.     Depression Screen No flowsheet data found.    Assessment & Plan:     Routine Health Maintenance and Physical Exam  Exercise Activities and Dietary recommendations Goals    None      Immunization History  Administered Date(s) Administered  . Influenza-Unspecified 04/12/2016  . Td 06/21/2014  . Tdap 06/21/2014    Health Maintenance  Topic Date Due  . HIV Screening  09/20/1977  . COLONOSCOPY  09/20/2012  . TETANUS/TDAP  06/21/2024  . INFLUENZA VACCINE  Completed  . Hepatitis C Screening  Completed      Discussed health benefits of physical activity, and encouraged him to engage in regular exercise appropriate for his age and condition.    1. Annual physical exam Normal physical exam today. Will check labs as below and f/u pending lab results. If labs are stable and WNL he will not need to have these rechecked for one year at his next annual physical exam. He is to call the office in the meantime if he has any acute issue, questions or concerns. - TSH  2. Prostate cancer screening No symptoms. Will check labs as below and f/u pending results. - PSA  3. Family history of diabetes mellitus in first degree  relative Sister has diabetes. Will check labs as below and f/u pending results. - Hemoglobin A1c  4. Encounter for lipid screening for cardiovascular disease Will check labs as below and f/u pending results. - Lipid panel  5. Essential hypertension Fairly stable on diltiazem 240mg  and lisinopril 40mg . Followed by Dr. Darrold Junker.  - CBC with Differential/Platelet - Comprehensive metabolic panel  6. Gastroesophageal reflux disease, esophagitis presence not specified Stable. Diagnosis pulled for medication refill. Continue current medical treatment plan. - ranitidine (ZANTAC) 300 MG tablet; Take 1 tablet (300 mg total) by mouth as needed.  Dispense: 90 tablet; Refill: 3  --------------------------------------------------------------------    Margaretann Loveless, PA-C  [facility]

## 2016-05-17 NOTE — Patient Instructions (Signed)

## 2016-05-18 LAB — COMPREHENSIVE METABOLIC PANEL
ALK PHOS: 78 IU/L (ref 39–117)
ALT: 16 IU/L (ref 0–44)
AST: 17 IU/L (ref 0–40)
Albumin/Globulin Ratio: 1.6 (ref 1.2–2.2)
Albumin: 4.3 g/dL (ref 3.5–5.5)
BUN/Creatinine Ratio: 17 (ref 9–20)
BUN: 19 mg/dL (ref 6–24)
Bilirubin Total: 0.4 mg/dL (ref 0.0–1.2)
CALCIUM: 9.2 mg/dL (ref 8.7–10.2)
CO2: 25 mmol/L (ref 18–29)
CREATININE: 1.11 mg/dL (ref 0.76–1.27)
Chloride: 99 mmol/L (ref 96–106)
GFR calc Af Amer: 87 mL/min/{1.73_m2} (ref >59)
GFR, EST NON AFRICAN AMERICAN: 75 mL/min/{1.73_m2} (ref >59)
GLOBULIN, TOTAL: 2.7 g/dL (ref 1.5–4.5)
GLUCOSE: 99 mg/dL (ref 65–99)
Potassium: 4.8 mmol/L (ref 3.5–5.2)
Sodium: 140 mmol/L (ref 134–144)
Total Protein: 7 g/dL (ref 6.0–8.5)

## 2016-05-18 LAB — CBC WITH DIFFERENTIAL/PLATELET
BASOS ABS: 0 10*3/uL (ref 0.0–0.2)
Basos: 0 %
EOS (ABSOLUTE): 0.3 10*3/uL (ref 0.0–0.4)
EOS: 4 %
HEMATOCRIT: 40.3 % (ref 37.5–51.0)
Hemoglobin: 13.5 g/dL (ref 12.6–17.7)
IMMATURE GRANULOCYTES: 0 %
Immature Grans (Abs): 0 10*3/uL (ref 0.0–0.1)
Lymphocytes Absolute: 2.4 10*3/uL (ref 0.7–3.1)
Lymphs: 26 %
MCH: 30.3 pg (ref 26.6–33.0)
MCHC: 33.5 g/dL (ref 31.5–35.7)
MCV: 91 fL (ref 79–97)
MONOCYTES: 10 %
MONOS ABS: 0.9 10*3/uL (ref 0.1–0.9)
NEUTROS PCT: 60 %
Neutrophils Absolute: 5.5 10*3/uL (ref 1.4–7.0)
PLATELETS: 282 10*3/uL (ref 150–379)
RBC: 4.45 x10E6/uL (ref 4.14–5.80)
RDW: 13.2 % (ref 12.3–15.4)
WBC: 9.1 10*3/uL (ref 3.4–10.8)

## 2016-05-18 LAB — LIPID PANEL
CHOLESTEROL TOTAL: 191 mg/dL (ref 100–199)
Chol/HDL Ratio: 4 ratio units (ref 0.0–5.0)
HDL: 48 mg/dL (ref >39)
LDL Calculated: 118 mg/dL — ABNORMAL HIGH (ref 0–99)
TRIGLYCERIDES: 126 mg/dL (ref 0–149)
VLDL CHOLESTEROL CAL: 25 mg/dL (ref 5–40)

## 2016-05-18 LAB — HEMOGLOBIN A1C
ESTIMATED AVERAGE GLUCOSE: 105 mg/dL
HEMOGLOBIN A1C: 5.3 % (ref 4.8–5.6)

## 2016-05-18 LAB — TSH: TSH: 3.8 u[IU]/mL (ref 0.450–4.500)

## 2016-05-18 LAB — PSA: Prostate Specific Ag, Serum: 0.6 ng/mL (ref 0.0–4.0)

## 2016-05-20 ENCOUNTER — Telehealth: Payer: Self-pay

## 2016-05-20 NOTE — Telephone Encounter (Signed)
-----   Message from Margaretann LovelessJennifer M Burnette, [place]PA-C sent at 05/19/2016  4:11 PM EDT ----- All labs are within normal limits and stable.  Thanks! -JB

## 2016-05-20 NOTE — Telephone Encounter (Signed)
Patient advised as below.  

## 2016-06-04 LAB — COLOGUARD

## 2016-06-17 ENCOUNTER — Encounter: Payer: Self-pay | Admitting: Physician Assistant

## 2016-06-17 ENCOUNTER — Ambulatory Visit (INDEPENDENT_AMBULATORY_CARE_PROVIDER_SITE_OTHER): Payer: BLUE CROSS/BLUE SHIELD | Admitting: Physician Assistant

## 2016-06-17 VITALS — BP 136/60 | HR 64 | Temp 98.2°F | Resp 20 | Wt 214.0 lb

## 2016-06-17 DIAGNOSIS — M170 Bilateral primary osteoarthritis of knee: Secondary | ICD-10-CM

## 2016-06-17 DIAGNOSIS — E8889 Other specified metabolic disorders: Secondary | ICD-10-CM

## 2016-06-17 DIAGNOSIS — R195 Other fecal abnormalities: Secondary | ICD-10-CM | POA: Diagnosis not present

## 2016-06-17 MED ORDER — METHYLPREDNISOLONE ACETATE 80 MG/ML IJ SUSP
80.0000 mg | Freq: Once | INTRAMUSCULAR | Status: AC
Start: 2016-06-17 — End: 2016-06-17
  Administered 2016-06-17: 80 mg via INTRA_ARTICULAR

## 2016-06-17 MED ORDER — METHYLPREDNISOLONE ACETATE 80 MG/ML IJ SUSP: 80.0000 mg | Freq: Once | INTRAMUSCULAR | Status: DC

## 2016-06-17 MED ORDER — METHYLPREDNISOLONE ACETATE 80 MG/ML IJ SUSP
80.0000 mg | Freq: Once | INTRAMUSCULAR | Status: AC
  Administered 2016-06-17: 80 mg via INTRA_ARTICULAR

## 2016-06-17 NOTE — Progress Notes (Signed)
Patient: Maurice SchoolsHoward Lee Male    DOB: Feb 14, 1961   55 y.o.   MRN: [id number] Visit Date: 06/17/2016  Today's Provider: Margaretann LovelessJennifer M Trenise Turay, PA-C   Chief Complaint  Patient presents with  . Knee Pain   Subjective:    HPI Knee Pain: Patient presents with knee pain involving the  bilateral knee. Onset of the symptoms was several months ago. Inciting event: none known. Current symptoms include giving out, locking and stiffness. Pain is aggravated by any weight bearing and going up and down stairs.  Patient has had prior knee problems. Treatment to date: corticosteroid injection which was effective last year.   He is also here to discuss abnormal cologuard results. Cologuard testing was positive.     Allergies  Allergen Reactions  . 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 Sulfa Antibiotics      Current Outpatient Prescriptions:  .  cyclobenzaprine (FLEXERIL) 5 MG tablet, Take 1 tablet (5 mg total) by mouth 3 (three) times daily as needed for muscle spasms. (Patient not taking: Reported on 05/17/2016), Disp: 30 tablet, Rfl: 1 .  diltiazem (DILACOR XR) 240 MG 24 hr capsule, Take 1 capsule (240 mg total) by mouth daily., Disp: 90 capsule, Rfl: 0 .  lisinopril (PRINIVIL,ZESTRIL) 40 MG tablet, Take 1 tablet (40 mg total) by mouth daily., Disp: 90 tablet, Rfl: 3 .  naproxen (NAPROXEN DR) 500 MG EC tablet, Take 1 tablet (500 mg total) by mouth 2 (two) times daily as needed., Disp: 60 tablet, Rfl: 6 .  ranitidine (ZANTAC) 300 MG tablet, Take 1 tablet (300 mg total) by mouth as needed., Disp: 90 tablet, Rfl: 3  Review of Systems  Constitutional: Negative.   Respiratory: Negative.   Cardiovascular: Negative.   Gastrointestinal: Negative.   Musculoskeletal: Positive for  arthralgias.  Neurological: Negative.     Social History  Substance Use Topics  . Smoking status: Former Games developermoker  . Smokeless tobacco: Never Used  . Alcohol use No   Objective:   BP 136/60 (BP Location: Right Arm, Patient Position: Sitting, Cuff Size: Large)   Pulse 64   Temp 98.2 F (36.8 C) (Oral)   Resp 20   Wt 214 lb (97.1 kg)   BMI 33.52 kg/m   Physical Exam  Constitutional: He appears well-developed and well-nourished. No distress.  HENT:  Head: Normocephalic and atraumatic.  Neck: Normal range of motion. Neck supple. No JVD present. No tracheal deviation present. No thyromegaly present.  Cardiovascular: Normal rate, regular rhythm and normal heart sounds.  Exam reveals no gallop and no friction rub.   No murmur heard. Pulmonary/Chest: Effort normal and breath sounds normal. No respiratory distress. He has no wheezes. He has no rales.  Musculoskeletal: Normal range of motion. He exhibits no edema.       Right knee: He exhibits swelling (mild swelling). He exhibits normal range of motion, no effusion, no erythema, no LCL laxity, normal patellar mobility, no bony tenderness, normal meniscus and no MCL laxity. Tenderness found. Medial joint line and lateral joint line tenderness noted.       Left knee: He exhibits normal range of motion, no swelling, no effusion, no erythema, no LCL laxity, normal patellar mobility, no bony tenderness, normal meniscus and no MCL laxity. Tenderness found. Medial joint  line and lateral joint line tenderness noted.  Lymphadenopathy:    He has no cervical adenopathy.  Skin: He is not diaphoretic.  Vitals reviewed.     Assessment & Plan:     1. Primary osteoarthritis of both knees Depo-Medrol injections were given in knees bilaterally. See procedure note below. This was tolerated well. - methylPREDNISolone acetate (DEPO-MEDROL) injection 80 mg; Inject 1 mL (80 mg total) into the articular space once. - methylPREDNISolone acetate (DEPO-MEDROL)  injection 80 mg; Inject 1 mL (80 mg total) into the articular space once.  2. Abnormal stool test Cologuard testing was positive. Patient does have a serum cholinesterase defect which affects him undergoing general anesthesia. This was why the cologuard testing was done instead of colonoscopy in the first place. Being that test was positive I will refer him to gastroenterology for further evaluation and consideration for a diagnostic colonoscopy. He will have to discuss with the gastroenterologist and anesthesia options that may be available for him to undergo the colonoscopy. - Ambulatory referral to Gastroenterology  3. Serum cholinesterase defect (HCC) See above medical treatment plan.  Procedure note: Benefits, risks (including infection, tattooing, adipose dimpling, and tendon rupture) and alternatives were explained to the patient. All questions were sought and answered.  Patient agreed to continue and verbal consent was obtained.   An aspiration and steroid injection was performed on right and left knee using 4cc of 1% plain Xyloocaine and 80 mg of depo-medrol for each knee. There was minimal bleeding. Hemostasis was intact. A dry bandage was applied. The procedure was well tolerated.       Margaretann LovelessJennifer M Miron Marxen, PA-C  [facility]

## 2016-06-17 NOTE — Patient Instructions (Signed)
Knee Injection, Care After Refer to this sheet in the next few weeks. These instructions provide you with information about caring for yourself after your procedure. Your health care provider may also give you more specific instructions. Your treatment has been planned according to current medical practices, but problems sometimes occur. Call your health care provider if you have any problems or questions after your procedure. What can I expect after the procedure? After the procedure, it is common to have:  Soreness.  Warmth.  Swelling. You may have more pain, swelling, and warmth than you did before the injection. This reaction may last for about one day. Follow these instructions at home: Bathing   If you were given a bandage (dressing), keep it dry until your health care provider says it can be removed. Ask your health care provider when you can start showering or taking a bath. Managing pain, stiffness, and swelling   If directed, apply ice to the injection area:  Put ice in a plastic bag.  Place a towel between your skin and the bag.  Leave the ice on for 20 minutes, 2-3 times per day.  Do not apply heat to your knee.  Raise the injection area above the level of your heart while you are sitting or lying down. Activity   Avoid strenuous activities for as long as directed by your health care provider. Ask your health care provider when you can return to your normal activities. General instructions   Take medicines only as directed by your health care provider.  Do not take aspirin or other over-the-counter medicines unless your health care provider says you can.  Check your injection site every day for signs of infection. Watch for:  Redness, swelling, or pain.  Fluid, blood, or pus.  Follow your health care provider's instructions about dressing changes and removal. Contact a health care provider if:  You have symptoms at your injection site that last longer than  two days after your procedure.  You have redness, swelling, or pain in your injection area.  You have fluid, blood, or pus coming from your injection site.  You have warmth in your injection area.  You have a fever.  Your pain is not controlled with medicine. Get help right away if:  Your knee turns very red.  Your knee becomes very swollen.  Your knee pain is severe. This information is not intended to replace advice given to you by your health care provider. Make sure you discuss any questions you have with your health care provider. Document Released: 08/05/2014 Document Revised: 03/20/2016 Document Reviewed: 05/25/2014 Elsevier Interactive Patient Education  2017 Elsevier Inc.  

## 2016-06-25 ENCOUNTER — Encounter: Payer: Self-pay | Admitting: Physician Assistant

## 2016-06-28 ENCOUNTER — Telehealth: Payer: Self-pay

## 2016-06-28 ENCOUNTER — Other Ambulatory Visit: Payer: Self-pay

## 2016-06-28 NOTE — Telephone Encounter (Signed)
Gastroenterology Pre-Procedure Review  Request Date: 07/15/2016 Requesting Physician: Dr. Rosezetta SchlatterBurnette   PATIENT REVIEW QUESTIONS: The patient responded to the following health history questions as indicated:    1. Are you having any GI issues? no 2. Do you have a personal history of Polyps? no 3. Do you have a family history of Colon Cancer or Polyps? no 4. Diabetes Mellitus? no 5. Joint replacements in the past 12 months?no 6. Major health problems in the past 3 months?no 7. Any artificial heart valves, MVP, or defibrillator?no    MEDICATIONS & ALLERGIES:    Patient reports the following regarding taking any anticoagulation/antiplatelet therapy:   Plavix, Coumadin, Eliquis, Xarelto, Lovenox, Pradaxa, Brilinta, or Effient? no Aspirin? no  Patient confirms/reports the following medications:  Current Outpatient Prescriptions  Medication Sig Dispense Refill  . diltiazem (DILACOR XR) 240 MG 24 hr capsule Take 1 capsule (240 mg total) by mouth daily. 90 capsule 0  . lisinopril (PRINIVIL,ZESTRIL) 40 MG tablet Take 1 tablet (40 mg total) by mouth daily. 90 tablet 3  . naproxen (NAPROXEN DR) 500 MG EC tablet Take 1 tablet (500 mg total) by mouth 2 (two) times daily as needed. 60 tablet 6  . ranitidine (ZANTAC) 300 MG tablet Take 1 tablet (300 mg total) by mouth as needed. 90 tablet 3  . cyclobenzaprine (FLEXERIL) 5 MG tablet Take 1 tablet (5 mg total) by mouth 3 (three) times daily as needed for muscle spasms. (Patient not taking: Reported on 06/28/2016) 30 tablet 1   No current facility-administered medications for this visit.     Patient confirms/reports the following allergies:  Allergies  Allergen Reactions  . 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 Sulfa Antibiotics     No  orders of the defined types were placed in this encounter.   AUTHORIZATION INFORMATION Primary Insurance: 1D#: Group #:  Secondary Insurance: 1D#: Group #:  SCHEDULE INFORMATION: Date: 07/15/2016 Time: Location: [facility]

## 2016-06-28 NOTE — Telephone Encounter (Signed)
Completed.

## 2016-06-28 NOTE — Telephone Encounter (Signed)
Create orders and send packet.  

## 2016-06-28 NOTE — Telephone Encounter (Signed)
Screening Colonoscopy Z12.11 [facility] 07/15/2016 Dr. Anna  BCBS Pre cert is not required  

## 2016-07-09 ENCOUNTER — Emergency Department: Payer: Worker's Compensation

## 2016-07-09 DIAGNOSIS — I10 Essential (primary) hypertension: Secondary | ICD-10-CM | POA: Insufficient documentation

## 2016-07-09 DIAGNOSIS — Y990 Civilian activity done for income or pay: Secondary | ICD-10-CM | POA: Diagnosis not present

## 2016-07-09 DIAGNOSIS — W231XXA Caught, crushed, jammed, or pinched between stationary objects, initial encounter: Secondary | ICD-10-CM | POA: Diagnosis not present

## 2016-07-09 DIAGNOSIS — S62522A Displaced fracture of distal phalanx of left thumb, initial encounter for closed fracture: Secondary | ICD-10-CM | POA: Insufficient documentation

## 2016-07-09 DIAGNOSIS — Y939 Activity, unspecified: Secondary | ICD-10-CM | POA: Diagnosis not present

## 2016-07-09 DIAGNOSIS — S6992XA Unspecified injury of left wrist, hand and finger(s), initial encounter: Secondary | ICD-10-CM | POA: Diagnosis present

## 2016-07-09 DIAGNOSIS — Y929 Unspecified place or not applicable: Secondary | ICD-10-CM | POA: Insufficient documentation

## 2016-07-09 DIAGNOSIS — Z87891 Personal history of nicotine dependence: Secondary | ICD-10-CM | POA: Diagnosis not present

## 2016-07-09 NOTE — ED Triage Notes (Addendum)
Patient ambulatory to triage with steady gait, without difficulty or distress noted; pt employeed with [employer] [phone], Mort SawyersJessie Troxler, supervisor); pt reports smashed left thumb from 15-20lb metal; employer not listed in workers comp profile; called pt's supervisor but no answer, message left to return call regarding specified testing

## 2016-07-10 ENCOUNTER — Emergency Department
Admission: EM | Admit: 2016-07-10 | Discharge: 2016-07-10 | Disposition: A | Discharge status: Home / Self Care | Payer: Worker's Compensation | Attending: Emergency Medicine | Admitting: Emergency Medicine

## 2016-07-10 DIAGNOSIS — S62522A Displaced fracture of distal phalanx of left thumb, initial encounter for closed fracture: Secondary | ICD-10-CM

## 2016-07-10 MED ORDER — OXYCODONE-ACETAMINOPHEN 5-325 MG PO TABS
1.0000 | ORAL_TABLET | Freq: Once | ORAL | Status: AC
  Administered 2016-07-10: 1 via ORAL
  Filled 2016-07-10: qty 1

## 2016-07-10 MED ORDER — OXYCODONE-ACETAMINOPHEN 5-325 MG PO TABS: 1.0000 | ORAL_TABLET | ORAL | 0 refills | Status: DC | PRN

## 2016-07-10 NOTE — ED Notes (Signed)
Pt accompanied by his wife.

## 2016-07-10 NOTE — ED Provider Notes (Signed)
[name] Emergency Department Provider Note    First MD Initiated Contact with Patient 07/10/16 [phone]     (approximate)  I have reviewed the triage vital signs and the nursing notes.   HISTORY  Chief Complaint Finger Injury    HPI Maurice Lee is a 55 y.o. male presents with history of having his left thumb smashed by approximately 15-20 pound piece of metal while at work today at [employer]. Patient states his current pain score is 5 out of 10.   Past Medical History:  Diagnosis Date  . Hypertension   . Pseudocholinesterase deficiency     Patient Active Problem List   Diagnosis Date Noted  . Near syncope 03/22/2016  . Left knee pain 08/24/2015  . Calculus of kidney 06/07/2015  . Serum cholinesterase defect (HCC) 04/27/2015  . H/O renal calculi 02/22/2015  . Essential (primary) hypertension 06/21/2014    Past Surgical History:  Procedure Laterality Date  . Kidney stones removed      Prior to Admission medications   Medication Sig Start Date End Date Taking? Authorizing Provider  cyclobenzaprine (FLEXERIL) 5 MG tablet Take 1 tablet (5 mg total) by mouth 3 (three) times daily as needed for muscle spasms. Patient not taking: Reported on 06/28/2016 04/17/16   Margaretann LovelessJennifer M Burnette, PA-C  diltiazem (DILACOR XR) 240 MG 24 hr capsule Take 1 capsule (240 mg total) by mouth daily. 05/17/16   Margaretann LovelessJennifer M Burnette, PA-C  lisinopril (PRINIVIL,ZESTRIL) 40 MG tablet Take 1 tablet (40 mg total) by mouth daily. 09/26/15   Margaretann LovelessJennifer M Burnette, PA-C  naproxen (NAPROXEN DR) 500 MG EC tablet Take 1 tablet (500 mg total) by mouth 2 (two) times daily as needed. 11/15/15   Margaretann LovelessJennifer M Burnette, PA-C  oxyCODONE-acetaminophen (ROXICET) 5-325 MG tablet Take 1 tablet by mouth every 4 (four) hours as needed for severe pain. 07/10/16   Darci Currentandolph N Brown, MD  ranitidine (ZANTAC) 300 MG tablet Take 1 tablet (300 mg total) by mouth as needed. 05/17/16 05/17/17  Margaretann LovelessJennifer M  Burnette, PA-C    Allergies Mivacurium; Succinylcholine; Anesthesia s-i-40 [propofol]; and Sulfa antibiotics  Family History  Problem Relation Age of Onset  . COPD Mother   . Thyroid disease Mother   . Lupus Mother   . Congestive Heart Failure Mother   . Cancer Father     Social History Social History  Substance Use Topics  . Smoking status: Former Games developermoker  . Smokeless tobacco: Never Used  . Alcohol use No    Review of Systems Constitutional: No fever/chills Eyes: No visual changes. ENT: No sore throat. Cardiovascular: Denies chest pain. Respiratory: Denies shortness of breath. Gastrointestinal: No abdominal pain.  No nausea, no vomiting.  No diarrhea.  No constipation. Genitourinary: Negative for dysuria. Musculoskeletal: Negative for back pain.Positive for left thumb injury Skin: Negative for rash. Neurological: Negative for headaches, focal weakness or numbness.  10-point ROS otherwise negative.  ____________________________________________   PHYSICAL EXAM:  VITAL SIGNS: ED Triage Vitals  Enc Vitals Group     BP 07/09/16 2344 (!) 179/77     Pulse Rate 07/09/16 2344 80     Resp 07/09/16 2344 18     Temp 07/09/16 2344 98 F (36.7 C)     Temp Source 07/09/16 2344 Oral     SpO2 07/09/16 2344 97 %     Weight 07/09/16 2315 208 lb (94.3 kg)     Height 07/09/16 2315 5\' 7"  (1.702 m)     Head Circumference --  Peak Flow --      Pain Score 07/09/16 2315 6     Pain Loc --      Pain Edu? --      Excl. in GC? --     Constitutional: Alert and oriented. Well appearing and in no acute distress. Eyes: Conjunctivae are normal. PERRL. EOMI. Head: Atraumatic. Neck: No stridor.  Cardiovascular: Normal rate, regular rhythm. Good peripheral circulation. Grossly normal heart sounds. Respiratory: Normal respiratory effort.  No retractions. Lungs CTAB. Gastrointestinal: Soft and nontender. No distention.  Musculoskeletal: Superficial laceration noted at the elbow of  the left thumb. Ecchymoses at the nail bed extending subungual. Neurologic:  Normal speech and language. No gross focal neurologic deficits are appreciated.  Skin:  Skin is warm, dry. Superficial laceration of ecchymoses left thumb nailbed     RADIOLOGY I, [place] BROWN, personally viewed and evaluated these images (plain radiographs) as part of my medical decision making, as well as reviewing the written report by the radiologist.  Dg Finger Thumb Left  Result Date: 07/09/2016 CLINICAL DATA:  Bruising at the top of the left thumb EXAM: LEFT THUMB 2+V COMPARISON:  None. FINDINGS: Tiny ossific densities adjacent to the tuft, suspicious for tiny tuft fractures. No subluxation. IMPRESSION: Small bone fragments adjacent to the tuft of the first distal phalanx, suspicious for mildly displaced tuft fractures. Electronically Signed   By: Jasmine PangKim  Fujinaga M.D.   On: 07/09/2016 23:59     Procedures    INITIAL IMPRESSION / ASSESSMENT AND PLAN / ED COURSE  Pertinent labs & imaging results that were available during my care of the patient were reviewed by me and considered in my medical decision making (see chart for details).  Patient one Percocet in the emergency department aluminum and foam splint applied to the left thumb   Clinical Course     ____________________________________________  FINAL CLINICAL IMPRESSION(S) / ED DIAGNOSES  Final diagnoses:  Closed fracture of tuft of distal phalanx of left thumb     MEDICATIONS GIVEN DURING THIS VISIT:  Medications  oxyCODONE-acetaminophen (PERCOCET/ROXICET) 5-325 MG per tablet 1 tablet (1 tablet Oral Given 07/10/16 0356)     NEW OUTPATIENT MEDICATIONS STARTED DURING THIS VISIT:  New Prescriptions   OXYCODONE-ACETAMINOPHEN (ROXICET) 5-325 MG TABLET    Take 1 tablet by mouth every 4 (four) hours as needed for severe pain.    Modified Medications   No medications on file    Discontinued Medications   No medications on file       Note:  This document was prepared using Dragon voice recognition software and may include unintentional dictation errors.    Darci Currentandolph N Brown, MD 07/10/16 [phone]

## 2016-07-10 NOTE — ED Notes (Signed)
Pt discharged by Clinton SawyerKailey, RN

## 2016-07-11 ENCOUNTER — Encounter: Payer: Self-pay | Admitting: Physician Assistant

## 2016-07-11 ENCOUNTER — Ambulatory Visit (INDEPENDENT_AMBULATORY_CARE_PROVIDER_SITE_OTHER): Payer: BLUE CROSS/BLUE SHIELD | Admitting: Physician Assistant

## 2016-07-11 VITALS — BP 164/84 | HR 72 | Temp 98.1°F | Resp 16 | Wt 214.0 lb

## 2016-07-11 DIAGNOSIS — S62525A Nondisplaced fracture of distal phalanx of left thumb, initial encounter for closed fracture: Secondary | ICD-10-CM

## 2016-07-11 NOTE — Progress Notes (Signed)
Patient: Maurice SchoolsHoward Shepheard Male    DOB: Feb 10, 1961   55 y.o.   MRN: [id number] Visit Date: 07/11/2016  Today's Provider: Margaretann LovelessJennifer M Burnette, PA-C   Chief Complaint  Patient presents with  . Hospitalization Follow-up   Subjective:    HPI     Follow up ER visit  Patient was seen in ER for finger injury on 07/10/2016. He was treated for closed fracture of tuft of distal phalanx of left thumb. Treatment for this included Percocet and splint. He reports unsatisfactory compliance with treatment. Pt reports he has not filled the Oxycodone due to the medication causing nausea at the ED. He reports this condition is Unchanged. He had a 12 x 12 that was about 2-3 inches thick come back and catch his left thumb between the piece of metal and the dumpster he was throwing it away in. Crush fracture/injury to the distal phalanx of the left thumb, nail is cracked lengthwise down and skin over nailbed was slightly disrupted. It seems to be a superficial avulsion injury that did not seem to involve the nailbed but it is possible that it could be involved. He has been compliant with the splint. He does report having some difficulty with any movement. ------------------------------------------------------------------------------------    Allergies  Allergen Reactions  . 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 Anesthesia S-I-40 [Propofol] Other (See Comments)    He won't wake up   . Sulfa Antibiotics      Current Outpatient Prescriptions:  .  diltiazem (DILACOR XR) 240 MG 24 hr capsule, Take 1 capsule (240 mg total) by mouth daily., Disp: 90 capsule, Rfl: 0 .  lisinopril (PRINIVIL,ZESTRIL) 40 MG tablet, Take 1 tablet (40 mg total) by mouth daily., Disp: 90 tablet, Rfl: 3 .   naproxen (NAPROXEN DR) 500 MG EC tablet, Take 1 tablet (500 mg total) by mouth 2 (two) times daily as needed., Disp: 60 tablet, Rfl: 6 .  ranitidine (ZANTAC) 300 MG tablet, Take 1 tablet (300 mg total) by mouth as needed., Disp: 90 tablet, Rfl: 3 .  oxyCODONE-acetaminophen (ROXICET) 5-325 MG tablet, Take 1 tablet by mouth every 4 (four) hours as needed for severe pain. (Patient not taking: Reported on 07/11/2016), Disp: 15 tablet, Rfl: 0  Review of Systems  Constitutional: Negative for activity change, appetite change, chills, diaphoresis, fatigue, fever and unexpected weight change.  Respiratory: Negative.   Cardiovascular: Negative.   Gastrointestinal: Negative.   Musculoskeletal: Positive for arthralgias and joint swelling.  Neurological: Negative for numbness.    Social History  Substance Use Topics  . Smoking status: Former Games developermoker  . Smokeless tobacco: Never Used  . Alcohol use No   Objective:   BP (!) 164/84 (BP Location: Right Arm, Patient Position: Sitting, Cuff Size: Large)   Pulse 72   Temp 98.1 F (36.7 C) (Oral)   Resp 16   Wt 214 lb (97.1 kg)   BMI 33.52 kg/m   Physical Exam  Constitutional: He appears well-developed and well-nourished. No distress.  HENT:  Head: Normocephalic and atraumatic.  Neck: Normal range of motion. Neck supple.  Cardiovascular: Normal rate, regular rhythm and normal heart sounds.  Exam reveals no gallop and no friction rub.   No murmur heard. Pulmonary/Chest: Effort normal and breath sounds normal. No  respiratory distress. He has no wheezes. He has no rales.  Musculoskeletal:       Right hand: Normal.       Left hand: He exhibits decreased range of motion, bony tenderness, laceration and swelling. He exhibits no deformity. Normal sensation noted. Decreased strength noted. He exhibits thumb/finger opposition.       Hands: Skin: He is not diaphoretic.  Vitals reviewed.       Assessment & Plan:     1. Closed nondisplaced fracture of  distal phalanx of left thumb, initial encounter Wound was cleaned, split replaced and wrapped. Will refer to ortho for further evaluation to make sure conservative therapy is appropriate and to make sure nailbed is not involved. I will see him in one week to re-evaluate. - Ambulatory referral to Orthopedic Surgery     Patient seen and examined by Margaretann LovelessJennifer M Burnette, PA-C, and note scribed by Allene DillonEmily Drozdowski, CMA.  Margaretann LovelessJennifer M Burnette, PA-C  [facility]

## 2016-07-11 NOTE — Patient Instructions (Signed)
Thumb Fracture A thumb fracture is a break in one of the two bones of your thumb. The thumb bone that goes from the tip of your thumb to the first joint in your thumb is called the distal phalanx. The thumb bone that goes from the first joint to the joint at the base of your thumb is called the proximal phalanx. Breaks that occur at the joints of your thumb are harder to treat. A broken thumb is more serious than a break in one of your other fingers because you need your thumb for grasping. Thumb fractures are also more likely to lead to pain and stiffness years after healing (arthritis). What are the causes? Thumb fractures may be caused by:  A direct blow to your thumb.  Stress on your thumb from it being pulled out of place. These types of injuries often happen as a result of:  Car accidents.  Bicycle accidents.  Falling with your hand outstretched.  Participating in sports such as wrestling, hockey, football, or skiing. What increases the risk? You may be more likely to break your thumb if you have a condition that causes your bones to become thin and brittle (osteoporosis). What are the signs or symptoms? The most common symptom is severe pain at the fracture site. Other signs and symptoms may include:  Swelling.  Bruising.  Not being able to move the thumb.  An abnormal shape of the thumb (deformity).  Numbness or coldness.  A red, black, or blue thumbnail. How is this diagnosed? Your health care provider may suspect a thumb fracture if you recently injured your thumb and have signs and symptoms of a fracture. An X-ray of your thumb may be done to confirm the diagnosis and determine how bad the break is. How is this treated? A thumb fracture should be treated as soon as possible. You may need to wear a padded splint to keep your thumb from moving and to protect your thumb until you can get a cast or have surgery. Treatment options include:  Immobilization.  A cast or  splint is put on the injured area without changing the position of the broken bone.  You may have to wear a type of cast called a spica cast or hitchhiker cast to hold the thumb in the proper position.  A cast is usually left on for 4?6 weeks.  Closed reduction.  In this procedure, the bones are put back into position without surgery.  Open reduction and internal fixation (ORIF). This is a surgical procedure.  First, the fracture site is opened up.  Then, the bone pieces are fixed into place with metal screws, plates, or wires.  External fixation.  In this type of open reduction, the fracture is held in place by metal pins.  The pins are attached to a stabilizing bar outside your skin.  You may need to wear a cast after surgery for up to 6 weeks.  You may need to return for X-rays to make sure your thumb is healing properly.  After your cast is taken off, you may need to do hand exercises (physical therapy) to get movement back in your thumb.  It may take another 3 months to regain complete use of your thumb. Follow these instructions at home:  Take medicines only as directed by your health care provider.  Keep your hand elevated above the level of your heart when resting.  Keep your cast dry when bathing. Cover it with a plastic bag as directed   by your health care provider.  After your cast is removed, exercise your thumb at home. Your health care provider may suggest that you:  Move your thumb in circles.  Touch your thumb to your little finger.  Do these exercises several times a day.  Ask your health care provider whether you can use a hand exerciser to strengthen your muscles.  If your thumb feels stiff while you are exercising it, try doing the exercises while soaking your hand in warm water.  Keep all follow-up visits as directed by your health care provider. This is important. Contact a health care provider if:  You have more than a small spot of bleeding  from under your cast or splint.  Your pain medicine is not helping.  You have a fever.  You have numbness or tingling in the injured area.  Your cast becomes loose or damaged.  You notice a bad odor or discharge coming from under your cast. Get help right away if:  You have pain that is very bad or getting worse.  You lose feeling in your thumb.  Your thumb turns pale or blue.  Your thumb feels cold.  You have drainage, redness, or swelling at the injury site. This information is not intended to replace advice given to you by your health care provider. Make sure you discuss any questions you have with your health care provider. Document Released: 04/13/2003 Document Revised: 03/17/2016 Document Reviewed: 09/17/2013 Elsevier Interactive Patient Education  2017 Elsevier Inc.  

## 2016-07-12 ENCOUNTER — Telehealth: Payer: Self-pay | Admitting: Physician Assistant

## 2016-07-12 ENCOUNTER — Encounter: Payer: Self-pay | Admitting: *Deleted

## 2016-07-12 NOTE — Telephone Encounter (Signed)
Pt stated he noticed that oxyCODONE-acetaminophen (ROXICET) 5-325 MG tablet was listed that he is allergic to the medication. Pt stated that he isn't and would like that removed. Pt stated the only time it makes him sick on his stomach is if he takes in on an empty stomach. Please advise. Thanks TNP

## 2016-07-12 NOTE — Telephone Encounter (Signed)
Done

## 2016-07-12 NOTE — Telephone Encounter (Signed)
Pt advised and FU OV canceled due to pt scheduling OV with ortho.

## 2016-07-15 ENCOUNTER — Telehealth: Payer: Self-pay

## 2016-07-15 ENCOUNTER — Encounter: Payer: Self-pay | Admitting: Family Medicine

## 2016-07-15 ENCOUNTER — Telehealth: Payer: Self-pay | Admitting: Physician Assistant

## 2016-07-15 ENCOUNTER — Other Ambulatory Visit: Payer: Self-pay | Admitting: Family Medicine

## 2016-07-15 ENCOUNTER — Ambulatory Visit
Admission: RE | Admit: 2016-07-15 | Discharge: 2016-07-15 | Disposition: A | Discharge status: Home / Self Care | Payer: BLUE CROSS/BLUE SHIELD | Source: Ambulatory Visit | Attending: Gastroenterology | Admitting: Gastroenterology

## 2016-07-15 ENCOUNTER — Ambulatory Visit: Payer: BLUE CROSS/BLUE SHIELD | Admitting: Registered Nurse

## 2016-07-15 ENCOUNTER — Encounter
Admission: RE | Disposition: A | Discharge status: Home / Self Care | Payer: Self-pay | Source: Ambulatory Visit | Attending: Gastroenterology

## 2016-07-15 ENCOUNTER — Ambulatory Visit (INDEPENDENT_AMBULATORY_CARE_PROVIDER_SITE_OTHER): Payer: BLUE CROSS/BLUE SHIELD | Admitting: Family Medicine

## 2016-07-15 VITALS — BP 156/90 | HR 72 | Temp 98.0°F | Resp 16 | Wt 214.6 lb

## 2016-07-15 DIAGNOSIS — R21 Rash and other nonspecific skin eruption: Secondary | ICD-10-CM

## 2016-07-15 DIAGNOSIS — Z6832 Body mass index (BMI) 32.0-32.9, adult: Secondary | ICD-10-CM | POA: Insufficient documentation

## 2016-07-15 DIAGNOSIS — Z79899 Other long term (current) drug therapy: Secondary | ICD-10-CM | POA: Insufficient documentation

## 2016-07-15 DIAGNOSIS — Z87891 Personal history of nicotine dependence: Secondary | ICD-10-CM | POA: Diagnosis not present

## 2016-07-15 DIAGNOSIS — I10 Essential (primary) hypertension: Secondary | ICD-10-CM | POA: Diagnosis not present

## 2016-07-15 DIAGNOSIS — E669 Obesity, unspecified: Secondary | ICD-10-CM | POA: Insufficient documentation

## 2016-07-15 DIAGNOSIS — Z1211 Encounter for screening for malignant neoplasm of colon: Secondary | ICD-10-CM

## 2016-07-15 HISTORY — PX: COLONOSCOPY WITH PROPOFOL: SHX5780

## 2016-07-15 HISTORY — DX: Adverse effect of unspecified anesthetic, initial encounter: T41.45XA

## 2016-07-15 HISTORY — DX: Other complications of anesthesia, initial encounter: T88.59XA

## 2016-07-15 SURGERY — COLONOSCOPY WITH PROPOFOL
Anesthesia: General

## 2016-07-15 MED ORDER — LIDOCAINE HCL (CARDIAC) 20 MG/ML IV SOLN
INTRAVENOUS | Status: DC | PRN
  Administered 2016-07-15: 40 mg via INTRAVENOUS

## 2016-07-15 MED ORDER — CEPHALEXIN 500 MG PO CAPS: 500.0000 mg | ORAL_CAPSULE | Freq: Four times a day (QID) | ORAL | 0 refills | Status: DC

## 2016-07-15 MED ORDER — SODIUM CHLORIDE 0.9 % IV SOLN
INTRAVENOUS | Status: DC
  Administered 2016-07-15: 1000 mL via INTRAVENOUS

## 2016-07-15 MED ORDER — FENTANYL CITRATE (PF) 100 MCG/2ML IJ SOLN
INTRAMUSCULAR | Status: DC | PRN
  Administered 2016-07-15: 50 ug via INTRAVENOUS

## 2016-07-15 MED ORDER — PROPOFOL 10 MG/ML IV BOLUS
INTRAVENOUS | Status: DC | PRN
  Administered 2016-07-15: 50 mg via INTRAVENOUS

## 2016-07-15 MED ORDER — MIDAZOLAM HCL 2 MG/2ML IJ SOLN
INTRAMUSCULAR | Status: DC | PRN
  Administered 2016-07-15: 1 mg via INTRAVENOUS

## 2016-07-15 MED ORDER — CEPHALEXIN 500 MG PO CAPS: 500.0000 mg | ORAL_CAPSULE | Freq: Two times a day (BID) | ORAL | 0 refills | Status: DC

## 2016-07-15 MED ORDER — PROPOFOL 500 MG/50ML IV EMUL
INTRAVENOUS | Status: DC | PRN
  Administered 2016-07-15: 175 ug/kg/min via INTRAVENOUS

## 2016-07-15 NOTE — Anesthesia Procedure Notes (Signed)
Performed by: Kilea Mccarey Pre-anesthesia Checklist: Patient identified, Emergency Drugs available, Suction available and Patient being monitored Patient Re-evaluated:Patient Re-evaluated prior to inductionOxygen Delivery Method: Nasal cannula Intubation Type: IV induction Dental Injury: Teeth and Oropharynx as per pre-operative assessment  Comments: Nasal cannula with etCO2 monitoring       

## 2016-07-15 NOTE — Op Note (Signed)
[facility]enter[facility] Patient Name: Maurice Lee Procedure Date: 07/15/2016 9:14 AM MRN: [id number] Account #: [id number] Date of Birth: 08-03-1960 Admit Type: Outpatient Age: 55 [facility] ROOM 3 Gender: Male Note Status: Finalized Procedure:            Colonoscopy Indications:          Screening for colorectal malignant neoplasm Providers:            Wyline MoodKiran Krystiana Fornes MD, MD Referring MD:         Margaretann LovelessJennifer M. Burnette (Referring MD) Medicines:            Monitored Anesthesia Care Complications:        No immediate complications. Procedure:            Pre-Anesthesia Assessment:                       - Prior to the procedure, a History and Physical was                        performed, and patient medications, allergies and                        sensitivities were reviewed. The patient's tolerance of                        previous anesthesia was reviewed.                       - The risks and benefits of the procedure and the                        sedation options and risks were discussed with the                        patient. All questions were answered and informed                        consent was obtained.                       - The risks and benefits of the procedure and the                        sedation options and risks were discussed with the                        patient. All questions were answered and informed                        consent was obtained.                       - ASA Grade Assessment: II - A patient with mild                        systemic disease.                       After obtaining informed consent, the colonoscope was  passed under direct vision. Throughout the procedure,                        the patient's blood pressure, pulse, and oxygen                        saturations were monitored continuously. The                        Colonoscope was introduced through the anus and             advanced to the the cecum, identified by the                        appendiceal orifice, IC valve and transillumination.                        The colonoscopy was performed with ease. The patient                        tolerated the procedure well. The quality of the bowel                        preparation was inadequate. Findings:      The perianal and digital rectal examinations were normal.      A large amount of stool was found in the entire colon, precluding       visualization.      The exam was otherwise without abnormality on direct and retroflexion       views. Impression:           - Preparation of the colon was inadequate.                       - Stool in the entire examined colon.                       - The examination was otherwise normal on direct and                        retroflexion views.                       - No specimens collected. Recommendation:       - Patient has a contact number available for                        emergencies. The signs and symptoms of potential                        delayed complications were discussed with the patient.                        Return to normal activities tomorrow. Written discharge                        instructions were provided to the patient.                       - Resume previous diet.                       -  Continue present medications.                       - Repeat colonoscopy in 4 weeks because the bowel                        preparation was suboptimal.                       - Discharge patient to home (with escort). Procedure Code(s):    --- Professional ---                       Z6109G0121, Colorectal cancer screening; colonoscopy on                        individual not meeting criteria for high risk Diagnosis Code(s):    --- Professional ---                       Z12.11, Encounter for screening for malignant neoplasm                        of colon CPT copyright 2016 American Medical Association.  All rights reserved. The codes documented in this report are preliminary and upon coder review may  be revised to meet current compliance requirements. Wyline MoodKiran Deshannon Hinchliffe, MD Wyline MoodKiran Lennyx Verdell MD, MD 07/15/2016 9:35:19 AM This report has been signed electronically. Number of Addenda: 0 Note Initiated On: 07/15/2016 9:14 AM Scope Withdrawal Time: 0 hours 3 minutes 27 seconds  Total Procedure Duration: 0 hours 10 minutes 7 seconds       [facility]

## 2016-07-15 NOTE — Telephone Encounter (Signed)
Sent in corrected rx  

## 2016-07-15 NOTE — Telephone Encounter (Signed)
Patient's wife reports that pt has rash on left arm and left side of abdominal area. Rash is described as "little red spots" pt denies pain, itching, or shortness of breath. Patient reports that he is allergic to sulfa pt did take the prep to have colonoscopy done. Patient not sure if it is an allergic reaction and GI recommended to FU with PCP. Appointment scheduled for today with Nadine CountsBob.

## 2016-07-15 NOTE — Telephone Encounter (Signed)
Wenli with [facility] called to verify direction on Rx cephALEXin (KEFLEX) 500 MG capsule.  CB#[phone]/MW

## 2016-07-15 NOTE — H&P (Signed)
  Maurice MoodKiran Norris Brumbach MD [address] [place], [place] [address] Phone: [phone] Fax : [phone]  Primary Care Physician:  Margaretann LovelessJennifer M Burnette, PA-C Primary Gastroenterologist:  Dr. Wyline MoodKiran Naquita Nappier   Pre-Procedure History & Physical: HPI:  Maurice Lee is a 55 y.o. male is here for an colonoscopy.   Past Medical History:  Diagnosis Date  . Complication of anesthesia   . Hypertension   . Pseudocholinesterase deficiency     Past Surgical History:  Procedure Laterality Date  . Kidney stones removed      Prior to Admission medications   Medication Sig Start Date End Date Taking? Authorizing Provider  diltiazem (DILACOR XR) 240 MG 24 hr capsule Take 1 capsule (240 mg total) by mouth daily. 05/17/16  Yes Alessandra BevelsJennifer M Burnette, PA-C  lisinopril (PRINIVIL,ZESTRIL) 40 MG tablet Take 1 tablet (40 mg total) by mouth daily. 09/26/15  Yes Alessandra BevelsJennifer M Burnette, PA-C  naproxen (NAPROXEN DR) 500 MG EC tablet Take 1 tablet (500 mg total) by mouth 2 (two) times daily as needed. 11/15/15  Yes Alessandra BevelsJennifer M Burnette, PA-C  ranitidine (ZANTAC) 300 MG tablet Take 1 tablet (300 mg total) by mouth as needed. 05/17/16 05/17/17 Yes Margaretann LovelessJennifer M Burnette, PA-C    Allergies as of 06/28/2016 - Review Complete 06/28/2016  Allergen Reaction Noted  . Mivacurium Other (See Comments) 07/18/2015  . Succinylcholine Other (See Comments) 07/18/2015  . Anesthesia s-i-40 [propofol] Other (See Comments) 06/28/2016  . Sulfa antibiotics  07/18/2015    Family History  Problem Relation Age of Onset  . COPD Mother   . Thyroid disease Mother   . Lupus Mother   . Congestive Heart Failure Mother   . Cancer Father     Social History   Social History  . Marital status: Married    Spouse name: N/A  . Number of children: N/A  . Years of education: N/A   Occupational History  . Not on file.   Social History Main Topics  . Smoking status: Former Games developermoker  . Smokeless tobacco: Never Used  . Alcohol use No  . Drug  use: No  . Sexual activity: Yes   Other Topics Concern  . Not on file   Social History Narrative  . No narrative on file    Review of Systems: See HPI, otherwise negative ROS  Physical Exam: BP (!) 148/63   Pulse 83   Temp 97.5 F (36.4 C) (Tympanic)   Resp 16   Ht 5\' 7"  (1.702 m)   Wt 210 lb (95.3 kg)   SpO2 100%   BMI 32.89 kg/m  General:   Alert,  pleasant and cooperative in NAD Head:  Normocephalic and atraumatic. Neck:  Supple; no masses or thyromegaly. Lungs:  Clear throughout to auscultation.    Heart:  Regular rate and rhythm. Abdomen:  Soft, nontender and nondistended. Normal bowel sounds, without guarding, and without rebound.   Neurologic:  Alert and  oriented x4;  grossly normal neurologically.  Impression/Plan: Maurice SchoolsHoward Lee is here for an colonoscopy to be performed for Screening colonoscopy average risk   Risks, benefits, limitations, and alternatives regarding  colonoscopy have been reviewed with the patient.  Questions have been answered.  All parties agreeable.   Maurice MoodKiran Berlie Persky, MD  07/15/2016, 8:43 AM

## 2016-07-15 NOTE — Anesthesia Preprocedure Evaluation (Signed)
Anesthesia Evaluation  Patient identified by MRN, date of birth, ID band Patient awake    History of Anesthesia Complications (+) PSEUDOCHOLINESTERASE DEFICIENCY and history of anesthetic complications  Airway Mallampati: II       Dental  (+) Teeth Intact   Pulmonary neg pulmonary ROS, former smoker,    breath sounds clear to auscultation       Cardiovascular hypertension, Pt. on medications  Rhythm:Regular Rate:Normal     Neuro/Psych    GI/Hepatic negative GI ROS, Neg liver ROS,   Endo/Other  negative endocrine ROS  Renal/GU      Musculoskeletal negative musculoskeletal ROS (+)   Abdominal (+) + obese,   Peds negative pediatric ROS (+)  Hematology negative hematology ROS (+)   Anesthesia Other Findings   Reproductive/Obstetrics                             Anesthesia Physical Anesthesia Plan  ASA: II  Anesthesia Plan: General   Post-op Pain Management:    Induction: Intravenous  Airway Management Planned: Natural Airway and Nasal Cannula  Additional Equipment:   Intra-op Plan:   Post-operative Plan:   Informed Consent: I have reviewed the patients History and Physical, chart, labs and discussed the procedure including the risks, benefits and alternatives for the proposed anesthesia with the patient or authorized representative who has indicated his/her understanding and acceptance.     Plan Discussed with: CRNA  Anesthesia Plan Comments:         Anesthesia Quick Evaluation

## 2016-07-15 NOTE — Anesthesia Postprocedure Evaluation (Signed)
Anesthesia Post Note  Patient: Maurice Lee  Procedure(s) Performed: Procedure(s) (LRB): COLONOSCOPY WITH PROPOFOL (N/A)  Patient location during evaluation: PACU Anesthesia Type: General Level of consciousness: awake Pain management: pain level controlled Vital Signs Assessment: post-procedure vital signs reviewed and stable Respiratory status: spontaneous breathing Cardiovascular status: stable Anesthetic complications: no    Last Vitals:  Vitals:   07/15/16 0841 07/15/16 0941  BP: (!) 148/63 127/75  Pulse: 83 87  Resp: 16 16  Temp: 36.4 C (!) 35.6 C    Last Pain:  Vitals:   07/15/16 0941  TempSrc: Tympanic                 VAN STAVEREN,[name]

## 2016-07-15 NOTE — Patient Instructions (Signed)
Clean rash with soap and water daily

## 2016-07-15 NOTE — Progress Notes (Signed)
Subjective:     Patient ID: Maurice SchoolsHoward Lee, male   DOB: [birth year], 55 y.o.   MRN: [id number]  HPI  Chief Complaint  Patient presents with  . Rash    Patient states he was preparing for a colonoscopy last night after drinking the cleanser theis morning woke up with a rash on right arm and left side of abdomen. Patient denies itching   Pt has had a new tattoo on his left anterior forearm and rash is noted next to the tattoo  and on his adjacent flank. Accompanied by his wife today. States his dog shares his bed with him. No hx of MRSA   Review of Systems     Objective:   Physical Exam  Constitutional: He appears well-developed and well-nourished. No distress.  Skin:  2-3 mm. Papules/pusutles noted bordering his tattoo on his left anterior arm and adjacent left flank.       Assessment:    1. Rash: ? Infection around his tattoo with further self inoculation. - cephALEXin (KEFLEX) 500 MG capsule; Take 1 capsule (500 mg total) by mouth 4 (four) times daily.  Dispense: 14 capsule; Refill: 0    Plan:    Discussed soap and water washes. Further f/u if not improving.

## 2016-07-15 NOTE — Transfer of Care (Signed)
Immediate Anesthesia Transfer of Care Note  Patient: Maurice SchoolsHoward Schnitzler  Procedure(s) Performed: Procedure(s): COLONOSCOPY WITH PROPOFOL (N/A)  Patient Location: PACU and Endoscopy Unit  Anesthesia Type:General  Level of Consciousness: sedated  Airway & Oxygen Therapy: Patient Spontanous Breathing and Patient connected to nasal cannula oxygen  Post-op Assessment: Report given to RN and Post -op Vital signs reviewed and stable  Post vital signs: Reviewed and stable  Last Vitals:  Vitals:   07/15/16 0841 07/15/16 0941  BP: (!) 148/63 127/75  Pulse: 83 87  Resp: 16 16  Temp: 36.4 C (!) 35.6 C    Complications: No apparent anesthesia complications

## 2016-07-15 NOTE — OR Nursing (Signed)
Very poor prep. 

## 2016-07-16 ENCOUNTER — Encounter: Payer: Self-pay | Admitting: Gastroenterology

## 2016-07-18 ENCOUNTER — Ambulatory Visit: Payer: BLUE CROSS/BLUE SHIELD | Admitting: Physician Assistant

## 2016-07-19 ENCOUNTER — Telehealth: Payer: Self-pay | Admitting: Physician Assistant

## 2016-07-19 NOTE — Telephone Encounter (Signed)
Please advise. Thanks.  

## 2016-07-19 NOTE — Telephone Encounter (Signed)
Maurice Lee,  Would you [facility] per patient preference for closed left thumb distal phalanx fracture.   Thanks.  JB

## 2016-07-19 NOTE — Telephone Encounter (Signed)
Pt [facility] and he thinks her name is Alexia. Pt stated he didn't like how the OV went. Pt stated that he would like to see Dr. Mins with [facility]. Please advise. Thanks TNP

## 2016-08-15 ENCOUNTER — Encounter: Payer: Self-pay | Admitting: Emergency Medicine

## 2016-08-15 ENCOUNTER — Emergency Department: Payer: BLUE CROSS/BLUE SHIELD

## 2016-08-15 ENCOUNTER — Inpatient Hospital Stay
Admission: EM | Admit: 2016-08-15 | Discharge: 2016-08-17 | DRG: 446 | Disposition: A | Discharge status: Home / Self Care | Payer: BLUE CROSS/BLUE SHIELD | Attending: Internal Medicine | Admitting: Internal Medicine

## 2016-08-15 DIAGNOSIS — R748 Abnormal levels of other serum enzymes: Secondary | ICD-10-CM

## 2016-08-15 DIAGNOSIS — I10 Essential (primary) hypertension: Secondary | ICD-10-CM | POA: Diagnosis present

## 2016-08-15 DIAGNOSIS — K219 Gastro-esophageal reflux disease without esophagitis: Secondary | ICD-10-CM | POA: Diagnosis present

## 2016-08-15 DIAGNOSIS — E8809 Other disorders of plasma-protein metabolism, not elsewhere classified: Secondary | ICD-10-CM | POA: Diagnosis present

## 2016-08-15 DIAGNOSIS — Z87891 Personal history of nicotine dependence: Secondary | ICD-10-CM

## 2016-08-15 DIAGNOSIS — Z79899 Other long term (current) drug therapy: Secondary | ICD-10-CM

## 2016-08-15 DIAGNOSIS — K8051 Calculus of bile duct without cholangitis or cholecystitis with obstruction: Secondary | ICD-10-CM | POA: Diagnosis not present

## 2016-08-15 DIAGNOSIS — R109 Unspecified abdominal pain: Secondary | ICD-10-CM

## 2016-08-15 DIAGNOSIS — Z888 Allergy status to other drugs, medicaments and biological substances status: Secondary | ICD-10-CM

## 2016-08-15 DIAGNOSIS — Z881 Allergy status to other antibiotic agents status: Secondary | ICD-10-CM | POA: Diagnosis not present

## 2016-08-15 DIAGNOSIS — Z8249 Family history of ischemic heart disease and other diseases of the circulatory system: Secondary | ICD-10-CM | POA: Diagnosis not present

## 2016-08-15 DIAGNOSIS — K8050 Calculus of bile duct without cholangitis or cholecystitis without obstruction: Secondary | ICD-10-CM

## 2016-08-15 DIAGNOSIS — R1011 Right upper quadrant pain: Secondary | ICD-10-CM | POA: Diagnosis present

## 2016-08-15 LAB — URINALYSIS, COMPLETE (UACMP) WITH MICROSCOPIC
Bacteria, UA: NONE SEEN
GLUCOSE, UA: NEGATIVE mg/dL
HGB URINE DIPSTICK: NEGATIVE
Ketones, ur: NEGATIVE mg/dL
LEUKOCYTES UA: NEGATIVE
Nitrite: NEGATIVE
PROTEIN: NEGATIVE mg/dL
Specific Gravity, Urine: 1.017 (ref 1.005–1.030)
Squamous Epithelial / LPF: NONE SEEN
pH: 6 (ref 5.0–8.0)

## 2016-08-15 LAB — CBC
HCT: 40.8 % (ref 40.0–52.0)
HEMOGLOBIN: 14.3 g/dL (ref 13.0–18.0)
MCH: 31.7 pg (ref 26.0–34.0)
MCHC: 35.1 g/dL (ref 32.0–36.0)
MCV: 90.3 fL (ref 80.0–100.0)
Platelets: 245 10*3/uL (ref 150–440)
RBC: 4.52 MIL/uL (ref 4.40–5.90)
RDW: 13.9 % (ref 11.5–14.5)
WBC: 7 10*3/uL (ref 3.8–10.6)

## 2016-08-15 LAB — COMPREHENSIVE METABOLIC PANEL
ALK PHOS: 164 U/L — AB (ref 38–126)
ALT: 256 U/L — ABNORMAL HIGH (ref 17–63)
ANION GAP: 7 (ref 5–15)
AST: 140 U/L — ABNORMAL HIGH (ref 15–41)
Albumin: 3.9 g/dL (ref 3.5–5.0)
BUN: 21 mg/dL — ABNORMAL HIGH (ref 6–20)
CALCIUM: 8.7 mg/dL — AB (ref 8.9–10.3)
CHLORIDE: 105 mmol/L (ref 101–111)
CO2: 24 mmol/L (ref 22–32)
Creatinine, Ser: 0.88 mg/dL (ref 0.61–1.24)
GFR calc non Af Amer: >60 mL/min (ref >60)
Glucose, Bld: 133 mg/dL — ABNORMAL HIGH (ref 65–99)
Potassium: 3.5 mmol/L (ref 3.5–5.1)
SODIUM: 136 mmol/L (ref 135–145)
Total Bilirubin: 5.8 mg/dL — ABNORMAL HIGH (ref 0.3–1.2)
Total Protein: 7.1 g/dL (ref 6.5–8.1)

## 2016-08-15 LAB — LIPASE, BLOOD: LIPASE: 38 U/L (ref 11–51)

## 2016-08-15 MED ORDER — LISINOPRIL 20 MG PO TABS
40.0000 mg | ORAL_TABLET | Freq: Every day | ORAL | Status: DC
  Administered 2016-08-15 – 2016-08-17 (×3): 40 mg via ORAL
  Filled 2016-08-15 (×3): qty 2

## 2016-08-15 MED ORDER — IOPAMIDOL (ISOVUE-300) INJECTION 61%: 30.0000 mL | Freq: Once | INTRAVENOUS | Status: DC | PRN

## 2016-08-15 MED ORDER — MORPHINE SULFATE (PF) 2 MG/ML IV SOLN: 1.0000 mg | INTRAVENOUS | Status: DC | PRN

## 2016-08-15 MED ORDER — IOPAMIDOL (ISOVUE-300) INJECTION 61%
100.0000 mL | Freq: Once | INTRAVENOUS | Status: AC | PRN
  Administered 2016-08-15: 100 mL via INTRAVENOUS

## 2016-08-15 MED ORDER — LEVOFLOXACIN IN D5W 750 MG/150ML IV SOLN
750.0000 mg | INTRAVENOUS | Status: DC
  Administered 2016-08-15 – 2016-08-16 (×2): 750 mg via INTRAVENOUS
  Filled 2016-08-15 (×3): qty 150

## 2016-08-15 MED ORDER — ONDANSETRON HCL 4 MG/2ML IJ SOLN
4.0000 mg | Freq: Four times a day (QID) | INTRAMUSCULAR | Status: DC | PRN
  Administered 2016-08-16 (×2): 4 mg via INTRAVENOUS
  Filled 2016-08-15 (×2): qty 2

## 2016-08-15 MED ORDER — ONDANSETRON HCL 4 MG PO TABS: 4.0000 mg | ORAL_TABLET | Freq: Four times a day (QID) | ORAL | Status: DC | PRN

## 2016-08-15 MED ORDER — ONDANSETRON HCL 4 MG/2ML IJ SOLN
4.0000 mg | Freq: Once | INTRAMUSCULAR | Status: AC
Start: 2016-08-15 — End: 2016-08-15
  Administered 2016-08-15: 4 mg via INTRAVENOUS
  Filled 2016-08-15: qty 2

## 2016-08-15 MED ORDER — FAMOTIDINE 20 MG PO TABS
40.0000 mg | ORAL_TABLET | Freq: Every day | ORAL | Status: DC
  Administered 2016-08-15 – 2016-08-17 (×3): 40 mg via ORAL
  Filled 2016-08-15 (×3): qty 2

## 2016-08-15 MED ORDER — LACTATED RINGERS IV BOLUS (SEPSIS)
1000.0000 mL | Freq: Once | INTRAVENOUS | Status: AC
  Administered 2016-08-15: 1000 mL via INTRAVENOUS
  Filled 2016-08-15: qty 1000

## 2016-08-15 MED ORDER — OXYCODONE-ACETAMINOPHEN 5-325 MG PO TABS
1.0000 | ORAL_TABLET | Freq: Four times a day (QID) | ORAL | Status: DC | PRN
  Administered 2016-08-15: 1 via ORAL
  Filled 2016-08-15: qty 2

## 2016-08-15 MED ORDER — BACITRACIN ZINC 500 UNIT/GM EX OINT
TOPICAL_OINTMENT | Freq: Two times a day (BID) | CUTANEOUS | Status: DC
  Administered 2016-08-15 – 2016-08-16 (×2): via TOPICAL
  Administered 2016-08-16: 1 via TOPICAL
  Filled 2016-08-15 (×4): qty 0.9

## 2016-08-15 MED ORDER — ACETAMINOPHEN 650 MG RE SUPP: 650.0000 mg | Freq: Four times a day (QID) | RECTAL | Status: DC | PRN

## 2016-08-15 MED ORDER — ENOXAPARIN SODIUM 40 MG/0.4ML ~~LOC~~ SOLN
40.0000 mg | SUBCUTANEOUS | Status: DC
  Administered 2016-08-15 – 2016-08-16 (×2): 40 mg via SUBCUTANEOUS
  Filled 2016-08-15 (×2): qty 0.4

## 2016-08-15 MED ORDER — DILTIAZEM HCL ER 240 MG PO CP24
240.0000 mg | ORAL_CAPSULE | Freq: Every day | ORAL | Status: DC
  Administered 2016-08-15 – 2016-08-17 (×3): 240 mg via ORAL
  Filled 2016-08-15 (×4): qty 1

## 2016-08-15 MED ORDER — ACETAMINOPHEN 325 MG PO TABS
650.0000 mg | ORAL_TABLET | Freq: Four times a day (QID) | ORAL | Status: DC | PRN
  Administered 2016-08-15 – 2016-08-16 (×2): 650 mg via ORAL
  Filled 2016-08-15 (×2): qty 2

## 2016-08-15 NOTE — ED Provider Notes (Signed)
[facility] Emergency Department Provider Note  ____________________________________________  Time seen: Approximately 1:51 PM  I have reviewed the triage vital signs and the nursing notes.   HISTORY  Chief Complaint Abdominal Pain    HPI Maurice Lee is a 56 y.o. male who complains of right upper quadrant abdominal pain radiating to the back for the past 3 days, worse after eating. Associated with diarrhea and nausea but no vomiting. No fevers chills chest pain or shortness of breath. No dizziness or syncope. Noticed blood in his urine. Pain is waxing and waning, severe. Feels like aching.     Past Medical History:  Diagnosis Date  . Complication of anesthesia   . Hypertension   . Pseudocholinesterase deficiency      Patient Active Problem List   Diagnosis Date Noted  . Right upper quadrant abdominal pain 08/15/2016  . Special screening for malignant neoplasms, colon   . Closed nondisplaced fracture of distal phalanx of left thumb 07/11/2016  . Near syncope 03/22/2016  . Left knee pain 08/24/2015  . Calculus of kidney 06/07/2015  . Serum cholinesterase defect (HCC) 04/27/2015  . H/O renal calculi 02/22/2015  . Essential (primary) hypertension 06/21/2014     Past Surgical History:  Procedure Laterality Date  . COLONOSCOPY WITH PROPOFOL N/A 07/15/2016   Procedure: COLONOSCOPY WITH PROPOFOL;  Surgeon: Wyline Mood, MD;  Location: [facility] ENDOSCOPY;  Service: Endoscopy;  Laterality: N/A;  . Kidney stones removed       Prior to Admission medications   Medication Sig Start Date End Date Taking? Authorizing Provider  cephALEXin (KEFLEX) 250 MG capsule Take 250 mg by mouth 2 (two) times daily. 08/13/16 08/19/16 Yes Historical Provider, MD  diltiazem (DILACOR XR) 240 MG 24 hr capsule Take 1 capsule (240 mg total) by mouth daily. 05/17/16  Yes Alessandra Bevels Burnette, PA-C  lisinopril (PRINIVIL,ZESTRIL) 40 MG tablet Take 1 tablet (40 mg total) by mouth  daily. 09/26/15  Yes Alessandra Bevels Burnette, PA-C  naproxen (NAPROXEN DR) 500 MG EC tablet Take 1 tablet (500 mg total) by mouth 2 (two) times daily as needed. 11/15/15  Yes Alessandra Bevels Burnette, PA-C  cephALEXin (KEFLEX) 500 MG capsule Take 1 capsule (500 mg total) by mouth 2 (two) times daily. Patient not taking: Reported on 08/15/2016 07/15/16   Anola Gurney, PA  ranitidine (ZANTAC) 300 MG tablet Take 1 tablet (300 mg total) by mouth as needed. 05/17/16 05/17/17  Margaretann Loveless, PA-C     Allergies Mivacurium; Succinylcholine; Anesthesia s-i-40 [propofol]; and Sulfa antibiotics   Family History  Problem Relation Age of Onset  . COPD Mother   . Thyroid disease Mother   . Lupus Mother   . Congestive Heart Failure Mother   . Cancer Father     Social History Social History  Substance Use Topics  . Smoking status: Former Smoker    Packs/day: 1.50    Years: 40.00  . Smokeless tobacco: Never Used  . Alcohol use No    Review of Systems  Constitutional:   No fever or chills.  ENT:   No sore throat. No rhinorrhea. Cardiovascular:   No chest pain. Respiratory:   No dyspnea or cough. Gastrointestinal:   Positive as above for abdominal pain and diarrhea.  Genitourinary:   Negative for dysuria or difficulty urinating. Positive hematuria Musculoskeletal:   Negative for focal pain or swelling Neurological:   Negative for headaches 10-point ROS otherwise negative.  ____________________________________________   PHYSICAL EXAM:  VITAL SIGNS: ED Triage Vitals  Enc Vitals Group     BP 08/15/16 1016 (!) 172/85     Pulse Rate 08/15/16 1016 71     Resp 08/15/16 1016 18     Temp 08/15/16 1016 97.7 F (36.5 C)     Temp Source 08/15/16 1016 Oral     SpO2 08/15/16 1016 100 %     Weight 08/15/16 1009 210 lb (95.3 kg)     Height 08/15/16 1009 5\' 7"  (1.702 m)     Head Circumference --      Peak Flow --      Pain Score 08/15/16 1009 10     Pain Loc --      Pain Edu? --      Excl.  in GC? --     Vital signs reviewed, nursing assessments reviewed.   Constitutional:   Alert and oriented. Well appearing and in no distress. Eyes:   No scleral icterus. No conjunctival pallor. PERRL. EOMI.  No nystagmus. ENT   Head:   Normocephalic and atraumatic.   Nose:   No congestion/rhinnorhea. No septal hematoma   Mouth/Throat:   MMM, no pharyngeal erythema. No peritonsillar mass.    Neck:   No stridor. No SubQ emphysema. No meningismus. Hematological/Lymphatic/Immunilogical:   No cervical lymphadenopathy. Cardiovascular:   RRR. Symmetric bilateral radial and DP pulses.  No murmurs.  Respiratory:   Normal respiratory effort without tachypnea nor retractions. Breath sounds are clear and equal bilaterally. No wheezes/rales/rhonchi. Gastrointestinal:   Soft With epigastric and right upper quadrant tenderness. Non distended. There is no CVA tenderness.  No rebound, rigidity, or guarding. Genitourinary:   deferred Musculoskeletal:   Nontender with normal range of motion in all extremities. No joint effusions.  No lower extremity tenderness.  No edema. Neurologic:   Normal speech and language.  CN 2-10 normal. Motor grossly intact. No gross focal neurologic deficits are appreciated.  Skin:    Skin is warm, dry and intact. No rash noted.  No petechiae, purpura, or bullae.  ____________________________________________    LABS (pertinent positives/negatives) (all labs ordered are listed, but only abnormal results are displayed) Labs Reviewed  COMPREHENSIVE METABOLIC PANEL - Abnormal; Notable for the following:       Result Value   Glucose, Bld 133 (*)    BUN 21 (*)    Calcium 8.7 (*)    AST 140 (*)    ALT 256 (*)    Alkaline Phosphatase 164 (*)    Total Bilirubin 5.8 (*)    All other components within normal limits  URINALYSIS, COMPLETE (UACMP) WITH MICROSCOPIC - Abnormal; Notable for the following:    Color, Urine AMBER (*)    APPearance CLEAR (*)    Bilirubin  Urine MODERATE (*)    All other components within normal limits  LIPASE, BLOOD  CBC   ____________________________________________   EKG  Interpreted by me  Date: 08/15/2016  Rate: 68  Rhythm: normal sinus rhythm  QRS Axis: normal  Intervals: normal  ST/T Wave abnormalities: normal  Conduction Disutrbances: none  Narrative Interpretation: unremarkable      ____________________________________________    RADIOLOGY Ultrasound right upper quadrant unremarkable, no gallstones, no cholecystitis CT abdomen and pelvis reveals dilated common bile duct likely due to distal obstruction. Other ancillary findings not of clinical significance.  ____________________________________________   PROCEDURES Procedures  ____________________________________________   INITIAL IMPRESSION / ASSESSMENT AND PLAN / ED COURSE  Pertinent labs & imaging results that were available during my care of the patient were reviewed by  me and considered in my medical decision making (see chart for details).  Patient presents with right upper quadrant abdominal pain consistent with biliary colic. Labs reveal elevated LFTs and pattern suggestive of common bile duct obstruction. Likely diagnosis is choledocholithiasis. We'll get ultrasound to evaluate for cholecystitis. Patients afebrile. Reports currently his pain is improved and mainly just has nausea. We'll give Zofran.     Clinical Course as of Aug 16 1635  Thu Aug 15, 2016  1350 [place] negative. Will f/u CT a/p  [PS]    Clinical Course User Index [PS] Sharman Cheek, MD    ----------------------------------------- 4:35 PM on 08/15/2016 -----------------------------------------  CT obtained which is consistent with choledocholithiasis causing ongoing biliary obstruction.  Not septic, needs GI evaluation. Case discussed with hospitalist for admission. Patient and family updated. ____________________________________________   FINAL CLINICAL  IMPRESSION(S) / ED DIAGNOSES  Final diagnoses:  Choledocholithiasis  RUQ pain      New Prescriptions   No medications on file     Portions of this note were generated with dragon dictation software. Dictation errors may occur despite best attempts at proofreading.    Sharman Cheek, MD 08/15/16 [phone]

## 2016-08-15 NOTE — H&P (Signed)
[employer] - [place] [facility]   PATIENT NAME: Maurice Lee    MR#:  [id number]  DATE OF BIRTH:  [birth year]  DATE OF ADMISSION:  08/15/2016  PRIMARY CARE PHYSICIAN: Margaretann Loveless, PA-C   REQUESTING/REFERRING PHYSICIAN: Dr. Alfonse Flavors  CHIEF COMPLAINT:   Chief Complaint  Patient presents with  . Abdominal Pain    HISTORY OF PRESENT ILLNESS:  Maurice Lee  is a 56 y.o. male with a known history of HTN, osteoarthritis who presents to the emergency room complaining of right upper quadrant abdominal pain that began 3 days ago. Patient describes the pain as achy in nature located in right upper quadrant nonradiating associated when he eats or drinks. He admits to nausea but no vomiting. He denies any Tobi Bastos, hematochezia, fever, chills. He does say that he is having intermittent hematuria. He presented to the emergency room was noted to have abnormal LFTs and a CT scan of the abdomen and pelvis consistent with possible choledocholithiasis. Hospitalist services and therefore contacted further treatment and evaluation.   PAST MEDICAL HISTORY:   Past Medical History:  Diagnosis Date  . Complication of anesthesia   . Hypertension   . Pseudocholinesterase deficiency     PAST SURGICAL HISTORY:   Past Surgical History:  Procedure Laterality Date  . COLONOSCOPY WITH PROPOFOL N/A 07/15/2016   Procedure: COLONOSCOPY WITH PROPOFOL;  Surgeon: Wyline Mood, MD;  Location: [facility] ENDOSCOPY;  Service: Endoscopy;  Laterality: N/A;  . Kidney stones removed      SOCIAL HISTORY:   Social History  Substance Use Topics  . Smoking status: Former Smoker    Packs/day: 1.50    Years: 40.00  . Smokeless tobacco: Never Used  . Alcohol use No    FAMILY HISTORY:   Family History  Problem Relation Age of Onset  . COPD Mother   . Thyroid disease Mother   . Lupus Mother   . Congestive Heart Failure Mother   . Cancer Father     DRUG ALLERGIES:   Allergies   Allergen Reactions  . 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 Anesthesia S-I-40 [Propofol] Other (See Comments)    He won't wake up   . Sulfa Antibiotics Rash    REVIEW OF SYSTEMS:   Review of Systems  Constitutional: Negative for fever and weight loss.  HENT: Negative for congestion, nosebleeds and tinnitus.   Eyes: Negative for blurred vision, double vision and redness.  Respiratory: Negative for cough, hemoptysis and shortness of breath.   Cardiovascular: Negative for chest pain, orthopnea, leg swelling and PND.  Gastrointestinal: Positive for abdominal pain (RUQ) and nausea. Negative for diarrhea, melena and vomiting.  Genitourinary: Negative for dysuria, hematuria and urgency.  Musculoskeletal: Negative for falls and joint pain.  Neurological: Negative for dizziness, tingling, sensory change, focal weakness, seizures, weakness and headaches.  Endo/Heme/Allergies: Negative for polydipsia. Does not bruise/bleed easily.  Psychiatric/Behavioral: Negative for depression and memory loss. The patient is not nervous/anxious.     MEDICATIONS AT HOME:   Prior to Admission medications   Medication Sig Start Date End Date Taking? Authorizing Provider  cephALEXin (KEFLEX) 250 MG capsule Take 250 mg by mouth 2 (two) times daily. 08/13/16 08/19/16 Yes Historical Provider, MD  diltiazem (DILACOR XR) 240 MG 24 hr capsule Take 1 capsule (  240 mg total) by mouth daily. 05/17/16  Yes Alessandra Bevels Burnette, PA-C  lisinopril (PRINIVIL,ZESTRIL) 40 MG tablet Take 1 tablet (40 mg total) by mouth daily. 09/26/15  Yes Alessandra Bevels Burnette, PA-C  naproxen (NAPROXEN DR) 500 MG EC tablet Take 1 tablet (500 mg total) by mouth 2 (two) times daily as needed. 11/15/15  Yes Alessandra Bevels Burnette, PA-C  cephALEXin  (KEFLEX) 500 MG capsule Take 1 capsule (500 mg total) by mouth 2 (two) times daily. Patient not taking: Reported on 08/15/2016 07/15/16   Anola Gurney, PA  ranitidine (ZANTAC) 300 MG tablet Take 1 tablet (300 mg total) by mouth as needed. 05/17/16 05/17/17  Margaretann Loveless, PA-C      VITAL SIGNS:  Blood pressure (!) 172/85, pulse 71, temperature 97.7 F (36.5 C), temperature source Oral, resp. rate 18, height 5\' 7"  (1.702 m), weight 95.3 kg (210 lb), SpO2 100 %.  PHYSICAL EXAMINATION:  Physical Exam  GENERAL:  56 y.o.-year-old patient lying in the bed with no acute distress.  EYES: Pupils equal, round, reactive to light and accommodation. No scleral icterus. Extraocular muscles intact.  HEENT: Head atraumatic, normocephalic. Oropharynx and nasopharynx clear. No oropharyngeal erythema, moist oral mucosa  NECK:  Supple, no jugular venous distention. No thyroid enlargement, no tenderness.  LUNGS: Normal breath sounds bilaterally, no wheezing, rales, rhonchi. No use of accessory muscles of respiration.  CARDIOVASCULAR: S1, S2 RRR. No murmurs, rubs, gallops, clicks.  ABDOMEN: Soft, tender in RUQ, no rebound, rigidity, nondistended. Bowel sounds present. No organomegaly or mass.  EXTREMITIES: No pedal edema, cyanosis, or clubbing. + 2 pedal & radial pulses b/l.   NEUROLOGIC: Cranial nerves II through XII are intact. No focal Motor or sensory deficits appreciated b/l PSYCHIATRIC: The patient is alert and oriented x 3. Good affect.  SKIN: No obvious rash, lesion, or ulcer.   LABORATORY PANEL:   CBC  Recent Labs Lab 08/15/16 1018  WBC 7.0  HGB 14.3  HCT 40.8  PLT 245   ------------------------------------------------------------------------------------------------------------------  Chemistries   Recent Labs Lab 08/15/16 1018  NA 136  K 3.5  CL 105  CO2 24  GLUCOSE 133*  BUN 21*  CREATININE 0.88  CALCIUM 8.7*  AST 140*  ALT 256*  ALKPHOS 164*  BILITOT 5.8*    ------------------------------------------------------------------------------------------------------------------  Cardiac Enzymes No results for input(s): TROPONINI in the last 168 hours. ------------------------------------------------------------------------------------------------------------------  RADIOLOGY:  Ct Abdomen Pelvis W Contrast  Result Date: 08/15/2016 CLINICAL DATA:  Right upper quadrant abdominal pain and diarrhea for the past 4 days. Nausea. EXAM: CT ABDOMEN AND PELVIS WITH CONTRAST TECHNIQUE: Multidetector CT imaging of the abdomen and pelvis was performed using the standard protocol following bolus administration of intravenous contrast. CONTRAST:  ISOVUE-300 IOPAMIDOL (ISOVUE-300) INJECTION 61% COMPARISON:  07/18/2015. FINDINGS: Lower chest: 3 mm left lower lobe nodule on image number 10 of series 4. This is unchanged. Hepatobiliary: Interval mild intrahepatic and extrahepatic biliary ductal dilatation. The proximal common duct measures 11.0 mm in maximum diameter. Poorly distended gallbladder. Pancreas: Unremarkable. No pancreatic ductal dilatation or surrounding inflammatory changes. Spleen: Normal in size without focal abnormality. Adrenals/Urinary Tract: Small bilateral renal calculi. The largest is in the upper pole on the left, measuring 5 mm. No bladder or ureteral calculi and no hydronephrosis. Normal appearing adrenal glands. Stomach/Bowel: Interval mild diffuse enlargement of the appendix with a maximum diameter of 8 mm. No periappendiceal soft tissue stranding. Interval 2 small metallic foreign bodies in the cecum. Mild medium and fat density wall thickening involving  the cecum, not previously demonstrated. The cecum is currently under distended compared the previous examination. No pericecal soft tissue stranding. Remainder the colon has a normal appearance, as do the small bowel and stomach. Vascular/Lymphatic: Mild aortic calcification. No enlarged lymph nodes.  Reproductive: Prostate is unremarkable. Other: Small umbilical hernia containing fat. Musculoskeletal: Lumbar and lower thoracic spine degenerative changes. IMPRESSION: 1. Interval mild diffuse enlargement of the appendix without secondary signs of appendicitis. This could be normal for this patient or represent early changes of appendicitis. 2. Interval 2 small metallic foreign bodies in the cecum. These may represent ingested fragments of a dental filling. 3. Mild medium and fat density wall thickening involving the cecum this may represent changes due to current or previous cecal inflammation. 4. Interval mild intrahepatic and extrahepatic biliary ductal dilatation. This could be due to a nonvisualized distal common duct stone, stricture or mass. 5. Mild aortic atherosclerosis. Electronically Signed   By: Beckie SaltsSteven  Reid M.D.   On: 08/15/2016 14:15   [place] Abdomen Limited Ruq  Result Date: 08/15/2016 CLINICAL DATA:  Right upper quadrant pain and tenderness for 4 days. Elevated liver function test. EXAM: US ABDOMEN LIMITED - RIGHT UPPER QUADRANT COMPARISON:  None. FINDINGS: Gallbladder: Gallbladder is incompletely distended, likely due to fact that patient was NPO for only 2 hours prior to exam. No gallstones identified. No evidence of gallbladder wall thickening. No sonographic Murphy sign noted by sonographer. Common bile duct: Diameter: 5-6 mm, within normal limits status post cholecystectomy. Liver: No focal lesion identified. Within normal limits in parenchymal echogenicity. IMPRESSION: Incompletely distended gallbladder, however no gallstones visualized. No evidence of biliary ductal dilatation or hepatic abnormality. Electronically Signed   By: Myles RosenthalJohn  Stahl M.D.   On: 08/15/2016 13:45     IMPRESSION AND PLAN:   56 year old male with past medical history of hypertension, osteoarthritis, GERD who presents to the hospital due to right upper quadrant abdominal pain and noted to have abnormal LFTs.  1.  Right upper quadrant abdominal pain-suspect to be secondary to choledocholithiasis. Patient has a mild elevation of his LFTs, mildly elevated alkaline phosphatase and also mildly elevated bilirubin. His CT scan is suggestive of intrauterine extrahepatic biliary ductal dilatation consistent with possible common bile duct issue.  -Supportive care with antiemetics, pain control, IV fluids. -We'll get gastroenterology consult. We'll get MRCP. Empirically started on Levaquin.  2. Abnormal LFTs-secondary to #1. -We will follow LFTs.  3. Essential hypertension-continue Cardizem, lisinopril.  4. GERD-continue Pepcid.   All the records are reviewed and case discussed with ED provider. Management plans discussed with the patient, family and they are in agreement.  CODE STATUS: Full  TOTAL TIME TAKING CARE OF THIS PATIENT: 40  minutes.    Houston SirenSAINANI,VIVEK J M.D on 08/15/2016 at 4:19 PM  Between 7am to 6pm - Pager - [phone]  After 6pm go to [email or web address] - password [email or web address]  [facility]  Office  [phone]  CC: Primary care physician; Margaretann LovelessJennifer M Burnette, PA-C

## 2016-08-15 NOTE — ED Triage Notes (Signed)
Pt c/o RUQ pain since Monday with diarrhea. Still has gallbladder. Has had nausea. Also c/o blood in urine.

## 2016-08-15 NOTE — Progress Notes (Signed)
Pharmacy Antibiotic Note  Maurice SchoolsHoward Lee is a 56 y.o. male admitted on 08/15/2016 with  Intra-abdominal infection.  Pharmacy has been consulted for levofloxacin dosing.  Plan: Will start patient on levofloxacin 750mg  IV every 24 hours.   Height: 5\' 7"  (170.2 cm) Weight: 210 lb (95.3 kg) IBW/kg (Calculated) : 66.1  Temp (24hrs), Avg:97.7 F (36.5 C), Min:97.7 F (36.5 C), Max:97.7 F (36.5 C)   Recent Labs Lab 08/15/16 1018  WBC 7.0  CREATININE 0.88    Estimated Creatinine Clearance: 106.8 mL/min (by C-G formula based on SCr of 0.88 mg/dL).    Allergies  Allergen Reactions  . 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 Anesthesia S-I-40 [Propofol] Other (See Comments)    He won't wake up   . Sulfa Antibiotics Rash    Antimicrobials this admission: 1/18 Levofloxacin  >>   Dose adjustments this admission:   Microbiology results:   Thank you for allowing pharmacy to be a part of this patient's care.  Gardner CandleSheema M Tamre Cass, PharmD, BCPS Clinical Pharmacist 08/15/2016 7:02 PM

## 2016-08-16 ENCOUNTER — Inpatient Hospital Stay: Payer: BLUE CROSS/BLUE SHIELD | Admitting: Anesthesiology

## 2016-08-16 ENCOUNTER — Encounter: Payer: Self-pay | Admitting: Anesthesiology

## 2016-08-16 ENCOUNTER — Encounter
Admission: EM | Disposition: A | Discharge status: Home / Self Care | Payer: Self-pay | Source: Home / Self Care | Attending: Internal Medicine

## 2016-08-16 ENCOUNTER — Ambulatory Visit: Payer: BLUE CROSS/BLUE SHIELD | Admitting: Physician Assistant

## 2016-08-16 ENCOUNTER — Inpatient Hospital Stay: Payer: BLUE CROSS/BLUE SHIELD

## 2016-08-16 DIAGNOSIS — R748 Abnormal levels of other serum enzymes: Secondary | ICD-10-CM

## 2016-08-16 DIAGNOSIS — K8051 Calculus of bile duct without cholangitis or cholecystitis with obstruction: Secondary | ICD-10-CM

## 2016-08-16 HISTORY — PX: ERCP: SHX5425

## 2016-08-16 LAB — COMPREHENSIVE METABOLIC PANEL
ALT: 201 U/L — ABNORMAL HIGH (ref 17–63)
AST: 89 U/L — AB (ref 15–41)
Albumin: 3.4 g/dL — ABNORMAL LOW (ref 3.5–5.0)
Alkaline Phosphatase: 152 U/L — ABNORMAL HIGH (ref 38–126)
Anion gap: 8 (ref 5–15)
BUN: 18 mg/dL (ref 6–20)
CHLORIDE: 105 mmol/L (ref 101–111)
CO2: 26 mmol/L (ref 22–32)
Calcium: 8.7 mg/dL — ABNORMAL LOW (ref 8.9–10.3)
Creatinine, Ser: 0.79 mg/dL (ref 0.61–1.24)
Glucose, Bld: 113 mg/dL — ABNORMAL HIGH (ref 65–99)
POTASSIUM: 3.5 mmol/L (ref 3.5–5.1)
SODIUM: 139 mmol/L (ref 135–145)
Total Bilirubin: 6.3 mg/dL — ABNORMAL HIGH (ref 0.3–1.2)
Total Protein: 6.4 g/dL — ABNORMAL LOW (ref 6.5–8.1)

## 2016-08-16 LAB — CBC
HCT: 40.1 % (ref 40.0–52.0)
Hemoglobin: 14 g/dL (ref 13.0–18.0)
MCH: 31.8 pg (ref 26.0–34.0)
MCHC: 35 g/dL (ref 32.0–36.0)
MCV: 90.9 fL (ref 80.0–100.0)
PLATELETS: 236 10*3/uL (ref 150–440)
RBC: 4.41 MIL/uL (ref 4.40–5.90)
RDW: 13.6 % (ref 11.5–14.5)
WBC: 6 10*3/uL (ref 3.8–10.6)

## 2016-08-16 SURGERY — ERCP, WITH INTERVENTION IF INDICATED
Anesthesia: General

## 2016-08-16 MED ORDER — GADOBENATE DIMEGLUMINE 529 MG/ML IV SOLN
20.0000 mL | Freq: Once | INTRAVENOUS | Status: AC | PRN
  Administered 2016-08-16: 20 mL via INTRAVENOUS

## 2016-08-16 MED ORDER — LIDOCAINE HCL (PF) 2 % IJ SOLN
INTRAMUSCULAR | Status: AC
  Filled 2016-08-16: qty 2

## 2016-08-16 MED ORDER — LIDOCAINE 2% (20 MG/ML) 5 ML SYRINGE
INTRAMUSCULAR | Status: DC | PRN
  Administered 2016-08-16: 100 mg via INTRAVENOUS

## 2016-08-16 MED ORDER — SODIUM CHLORIDE 0.9 % IV SOLN
INTRAVENOUS | Status: DC
  Administered 2016-08-16: 1000 mL via INTRAVENOUS

## 2016-08-16 MED ORDER — PROPOFOL 500 MG/50ML IV EMUL
INTRAVENOUS | Status: AC
  Filled 2016-08-16: qty 50

## 2016-08-16 MED ORDER — PROPOFOL 10 MG/ML IV BOLUS
INTRAVENOUS | Status: DC | PRN
  Administered 2016-08-16: 60 mg via INTRAVENOUS
  Administered 2016-08-16: 40 mg via INTRAVENOUS

## 2016-08-16 MED ORDER — INDOMETHACIN 50 MG RE SUPP
100.0000 mg | Freq: Once | RECTAL | Status: AC
  Administered 2016-08-16: 100 mg via RECTAL

## 2016-08-16 MED ORDER — PROPOFOL 500 MG/50ML IV EMUL
INTRAVENOUS | Status: DC | PRN
  Administered 2016-08-16: 200 ug/kg/min via INTRAVENOUS

## 2016-08-16 MED ORDER — GLYCOPYRROLATE 0.2 MG/ML IJ SOLN
INTRAMUSCULAR | Status: DC | PRN
  Administered 2016-08-16: 0.2 mg via INTRAVENOUS

## 2016-08-16 NOTE — Progress Notes (Signed)
Patient needs an MRI of his abdomen as he is admitted with elevated liver function tests and bilirubin to r/o any obstruction. So, for that reason his ankle metal needs to come off.

## 2016-08-16 NOTE — Op Note (Signed)
[facility] Gastroenterology Patient Name: Maurice Lee Procedure Date: 08/16/2016 1:19 PM MRN: [id number] Account #: [id number] Date of Birth: July 24, 1961 Admit Type: Inpatient Age: 56 Room: [facility] ENDO ROOM 4 Gender: Male Note Status: Finalized Procedure:            ERCP Indications:          Suspected bile duct stone(s), Elevated liver enzymes Providers:            Midge Minium MD, MD Medicines:            Propofol per Anesthesia Complications:        No immediate complications. Procedure:            Pre-Anesthesia Assessment:                       - Prior to the procedure, a History and Physical was                        performed, and patient medications and allergies were                        reviewed. The patient's tolerance of previous                        anesthesia was also reviewed. The risks and benefits of                        the procedure and the sedation options and risks were                        discussed with the patient. All questions were                        answered, and informed consent was obtained. Prior                        Anticoagulants: The patient has taken no previous                        anticoagulant or antiplatelet agents. ASA Grade                        Assessment: II - A patient with mild systemic disease.                        After reviewing the risks and benefits, the patient was                        deemed in satisfactory condition to undergo the                        procedure.                       After obtaining informed consent, the scope was passed                        under direct vision. Throughout the procedure, the                        patient's  blood pressure, pulse, and oxygen saturations                        were monitored continuously. The Endoscope was                        introduced through the mouth, and used to inject                        contrast into and used to inject  contrast into the bile                        duct. The ERCP was accomplished without difficulty. The                        patient tolerated the procedure well. Findings:      The scout film was normal. The esophagus was successfully intubated       under direct vision. The scope was advanced to a normal major papilla in       the descending duodenum without detailed examination of the pharynx,       larynx and associated structures, and upper GI tract. The upper GI tract       was grossly normal. The bile duct was deeply cannulated with the       short-nosed traction sphincterotome. Contrast was injected. I personally       interpreted the bile duct images. There was brisk flow of contrast       through the ducts. Image quality was excellent. Contrast extended to the       entire biliary tree. The main bile duct was diffusely dilated, with a       stone causing an obstruction. The lower third of the main bile duct       contained one stone mm. A wire was passed into the biliary tree. A 7 mm       biliary sphincterotomy was made with a traction (standard)       sphincterotome using ERBE electrocautery. The sphincterotomy oozed       blood. The biliary tree was swept with a 15 mm balloon starting at the       bifurcation. Sludge was swept from the duct. One stone was removed. No       stones remained. Impression:           - The entire main bile duct was dilated, with a stone                        causing an obstruction.                       - Choledocholithiasis was found. Complete removal was                        accomplished by biliary sphincterotomy and balloon                        extraction.                       - A biliary sphincterotomy was performed.                       -  The biliary tree was swept. Recommendation:       - Clear liquid diet.                       - Watch for pancreatitis, bleeding, perforation, and                        cholangitis. Procedure  Code(s):    --- Professional ---                       [phone], Endoscopic retrograde cholangiopancreatography                        (ERCP); with removal of calculi/debris from                        biliary/pancreatic duct(s)                       [id number], Endoscopic retrograde cholangiopancreatography                        (ERCP); with sphincterotomy/papillotomy                       [phone] [id number], Endoscopic catheterization of the biliary ductal                        system, radiological supervision and interpretation Diagnosis Code(s):    --- Professional ---                       R74.8, Abnormal levels of other serum enzymes                       K80.51, Calculus of bile duct without cholangitis or                        cholecystitis with obstruction CPT copyright 2016 American [employer] Association. All rights reserved. The codes documented in this report are preliminary and upon coder review may  be revised to meet current compliance requirements. [name] MD, MD 08/16/2016 2:04:59 PM This report has been signed electronically. Number of Addenda: 0 Note Initiated On: 08/16/2016 1:19 PM      [facility]

## 2016-08-16 NOTE — Anesthesia Postprocedure Evaluation (Signed)
Anesthesia Post Note  Patient: Maurice Lee  Procedure(s) Performed: Procedure(s) (LRB): ENDOSCOPIC RETROGRADE CHOLANGIOPANCREATOGRAPHY (ERCP) (N/A)  Patient location during evaluation: Endoscopy Anesthesia Type: General Level of consciousness: awake and alert Pain management: pain level controlled Vital Signs Assessment: post-procedure vital signs reviewed and stable Respiratory status: spontaneous breathing, nonlabored ventilation, respiratory function stable and patient connected to nasal cannula oxygen Cardiovascular status: blood pressure returned to baseline and stable Postop Assessment: no signs of nausea or vomiting Anesthetic complications: no     Last Vitals:  Vitals:   08/16/16 1441 08/16/16 1523  BP: (!) 145/86 (!) 161/69  Pulse:  60  Resp:  20  Temp:  36.4 C    Last Pain:  Vitals:   08/16/16 1523  TempSrc: Oral  PainSc:                  Meklit Cotta S

## 2016-08-16 NOTE — Progress Notes (Signed)
[facility] [place] [facility]   PATIENT NAME: Maurice Lee    MR#:  [id number]  DATE OF BIRTH:  1961-07-29  SUBJECTIVE:  CHIEF COMPLAINT:   Chief Complaint  Patient presents with  . Abdominal Pain   - s/p ERCP and CBD stone extracted - feels better  REVIEW OF SYSTEMS:  Review of Systems  Constitutional: Negative for chills, fever and malaise/fatigue.  HENT: Negative for ear discharge, hearing loss and nosebleeds.   Eyes: Negative for blurred vision and double vision.  Respiratory: Negative for cough, shortness of breath and wheezing.   Cardiovascular: Negative for chest pain, palpitations and claudication.  Gastrointestinal: Positive for abdominal pain. Negative for constipation, diarrhea, nausea and vomiting.  Genitourinary: Negative for dysuria.  Musculoskeletal: Negative for myalgias.  Neurological: Negative for dizziness, sensory change, speech change, focal weakness, seizures and headaches.  Psychiatric/Behavioral: Negative for depression.    DRUG ALLERGIES:   Allergies  Allergen Reactions  . 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 Anesthesia S-I-40 [Propofol] Other (See Comments)    He won't wake up   . Sulfa Antibiotics Rash    VITALS:  Blood pressure (!) 161/69, pulse 60, temperature 97.6 F (36.4 C), temperature source Oral, resp. rate 20, height 5\' 7"  (1.702 m), weight 96.1 kg (211 lb 12.8 oz), SpO2 98 %.  PHYSICAL EXAMINATION:  Physical Exam  GENERAL:  56 y.o.-year-old patient lying in the bed with no acute distress.  EYES: Pupils equal, round, reactive to light and accommodation. No scleral icterus. Extraocular muscles intact.  HEENT: Head atraumatic, normocephalic. Oropharynx and nasopharynx clear.  NECK:  Supple, no  jugular venous distention. No thyroid enlargement, no tenderness.  LUNGS: Normal breath sounds bilaterally, no wheezing, rales,rhonchi or crepitation. No use of accessory muscles of respiration.  CARDIOVASCULAR: S1, S2 normal. No murmurs, rubs, or gallops.  ABDOMEN: Soft, minimally tender, nondistended. Bowel sounds present. No organomegaly or mass.  EXTREMITIES: No pedal edema, cyanosis, or clubbing.  NEUROLOGIC: Cranial nerves II through XII are intact. Muscle strength 5/5 in all extremities. Sensation intact. Gait not checked.  PSYCHIATRIC: The patient is alert and oriented x 3.  SKIN: No obvious rash, lesion, or ulcer.    LABORATORY PANEL:   CBC  Recent Labs Lab 08/16/16 0703  WBC 6.0  HGB 14.0  HCT 40.1  PLT 236   ------------------------------------------------------------------------------------------------------------------  Chemistries   Recent Labs Lab 08/16/16 0703  NA 139  K 3.5  CL 105  CO2 26  GLUCOSE 113*  BUN 18  CREATININE 0.79  CALCIUM 8.7*  AST 89*  ALT 201*  ALKPHOS 152*  BILITOT 6.3*   ------------------------------------------------------------------------------------------------------------------  Cardiac Enzymes No results for input(s): TROPONINI in the last 168 hours. ------------------------------------------------------------------------------------------------------------------  RADIOLOGY:  Ct Abdomen Pelvis W Contrast  Result Date: 08/15/2016 CLINICAL DATA:  Right upper quadrant abdominal pain and diarrhea for the past 4 days. Nausea. EXAM: CT ABDOMEN AND PELVIS WITH CONTRAST TECHNIQUE: Multidetector CT imaging of the abdomen and pelvis was performed using the standard protocol following bolus administration of intravenous contrast. CONTRAST:  100mL ISOVUE-300 IOPAMIDOL (ISOVUE-300) INJECTION 61% COMPARISON:  07/18/2015. FINDINGS: Lower chest: 3 mm left lower lobe nodule on image number 10 of series 4. This is unchanged. Hepatobiliary:  Interval mild intrahepatic and extrahepatic biliary ductal dilatation. The proximal  common duct measures 11.0 mm in maximum diameter. Poorly distended gallbladder. Pancreas: Unremarkable. No pancreatic ductal dilatation or surrounding inflammatory changes. Spleen: Normal in size without focal abnormality. Adrenals/Urinary Tract: Small bilateral renal calculi. The largest is in the upper pole on the left, measuring 5 mm. No bladder or ureteral calculi and no hydronephrosis. Normal appearing adrenal glands. Stomach/Bowel: Interval mild diffuse enlargement of the appendix with a maximum diameter of 8 mm. No periappendiceal soft tissue stranding. Interval 2 small metallic foreign bodies in the cecum. Mild medium and fat density wall thickening involving the cecum, not previously demonstrated. The cecum is currently under distended compared the previous examination. No pericecal soft tissue stranding. Remainder the colon has a normal appearance, as do the small bowel and stomach. Vascular/Lymphatic: Mild aortic calcification. No enlarged lymph nodes. Reproductive: Prostate is unremarkable. Other: Small umbilical hernia containing fat. Musculoskeletal: Lumbar and lower thoracic spine degenerative changes. IMPRESSION: 1. Interval mild diffuse enlargement of the appendix without secondary signs of appendicitis. This could be normal for this patient or represent early changes of appendicitis. 2. Interval 2 small metallic foreign bodies in the cecum. These may represent ingested fragments of a dental filling. 3. Mild medium and fat density wall thickening involving the cecum this may represent changes due to current or previous cecal inflammation. 4. Interval mild intrahepatic and extrahepatic biliary ductal dilatation. This could be due to a nonvisualized distal common duct stone, stricture or mass. 5. Mild aortic atherosclerosis. Electronically Signed   By: Beckie Salts M.D.   On: 08/15/2016 14:15   Mr 3d Recon At  Scanner  Result Date: 08/16/2016 CLINICAL DATA:  Right upper quadrant pain. Elevated liver function tests. Biliary duct dilatation on CT. EXAM: MRI ABDOMEN WITHOUT AND WITH CONTRAST (INCLUDING MRCP) TECHNIQUE: Multiplanar multisequence MR imaging of the abdomen was performed both before and after the administration of intravenous contrast. Heavily T2-weighted images of the biliary and pancreatic ducts were obtained, and three-dimensional MRCP images were rendered by post processing. CONTRAST:  20mL MULTIHANCE GADOBENATE DIMEGLUMINE 529 MG/ML IV SOLN COMPARISON:  CTs including 08/15/2016. FINDINGS: Lower chest: Normal heart size without pericardial or pleural effusion. Hepatobiliary: No focal liver lesion. Moderate intrahepatic biliary duct dilatation, including a an 8 mm left intrahepatic duct on image 8/series 8. Common duct is moderately dilated for age, including at 10 mm on image 12/series 8. Subtle distal common duct stone measures on the order of 9 mm on image 21/ series 4 and image 19/series 10. Normal gallbladder, without evidence of cholecystitis. Pancreas:  Normal, without mass or ductal dilatation. Spleen:  Normal in size, without focal abnormality. Adrenals/Urinary Tract: Normal adrenal glands. Too small to characterize left renal lesions. Normal right kidney, without hydronephrosis. Stomach/Bowel: Normal stomach, without wall thickening. Normal abdominal bowel loops. Vascular/Lymphatic: Aortic atherosclerosis. No retroperitoneal or retrocrural adenopathy. Other:  No ascites. Musculoskeletal: No acute osseous abnormality. IMPRESSION: Moderate biliary duct dilatation with 9 mm distal common duct stone. Electronically Signed   By: Jeronimo Greaves M.D.   On: 08/16/2016 13:23   Mr Abdomen Mrcp Vivien Rossetti Contast  Result Date: 08/16/2016 CLINICAL DATA:  Right upper quadrant pain. Elevated liver function tests. Biliary duct dilatation on CT. EXAM: MRI ABDOMEN WITHOUT AND WITH CONTRAST (INCLUDING MRCP) TECHNIQUE:  Multiplanar multisequence MR imaging of the abdomen was performed both before and after the administration of intravenous contrast. Heavily T2-weighted images of the biliary and pancreatic ducts were obtained, and three-dimensional MRCP images were rendered by post processing. CONTRAST:  20mL MULTIHANCE GADOBENATE DIMEGLUMINE  529 MG/ML IV SOLN COMPARISON:  CTs including 08/15/2016. FINDINGS: Lower chest: Normal heart size without pericardial or pleural effusion. Hepatobiliary: No focal liver lesion. Moderate intrahepatic biliary duct dilatation, including a an 8 mm left intrahepatic duct on image 8/series 8. Common duct is moderately dilated for age, including at 10 mm on image 12/series 8. Subtle distal common duct stone measures on the order of 9 mm on image 21/ series 4 and image 19/series 10. Normal gallbladder, without evidence of cholecystitis. Pancreas:  Normal, without mass or ductal dilatation. Spleen:  Normal in size, without focal abnormality. Adrenals/Urinary Tract: Normal adrenal glands. Too small to characterize left renal lesions. Normal right kidney, without hydronephrosis. Stomach/Bowel: Normal stomach, without wall thickening. Normal abdominal bowel loops. Vascular/Lymphatic: Aortic atherosclerosis. No retroperitoneal or retrocrural adenopathy. Other:  No ascites. Musculoskeletal: No acute osseous abnormality. IMPRESSION: Moderate biliary duct dilatation with 9 mm distal common duct stone. Electronically Signed   By: Jeronimo Greaves M.D.   On: 08/16/2016 13:23   US Abdomen Limited Ruq  Result Date: 08/15/2016 CLINICAL DATA:  Right upper quadrant pain and tenderness for 4 days. Elevated liver function test. EXAM: US ABDOMEN LIMITED - RIGHT UPPER QUADRANT COMPARISON:  None. FINDINGS: Gallbladder: Gallbladder is incompletely distended, likely due to fact that patient was NPO for only 2 hours prior to exam. No gallstones identified. No evidence of gallbladder wall thickening. No sonographic Murphy  sign noted by sonographer. Common bile duct: Diameter: 5-6 mm, within normal limits status post cholecystectomy. Liver: No focal lesion identified. Within normal limits in parenchymal echogenicity. IMPRESSION: Incompletely distended gallbladder, however no gallstones visualized. No evidence of biliary ductal dilatation or hepatic abnormality. Electronically Signed   By: Myles Rosenthal M.D.   On: 08/15/2016 13:45    EKG:   Orders placed or performed during the hospital encounter of 08/15/16  . ED EKG  . ED EKG    ASSESSMENT AND PLAN:   56 year old male with past medical history of hypertension, osteoarthritis, GERD who presents to the hospital due to right upper quadrant abdominal pain and noted to have abnormal LFTs.  1. Right upper quadrant abdominal pain- secondary to choledocholithiasis.  - elevated LFTs alkaline phosphatase and bilirubin.  - CT scan is suggestive of intrauterine extrahepatic biliary ductal dilatation consistent with possible common bile duct issue.  - Patient G a consult. MRCP confirming a stone in the CBD. Status post ERCP and extraction of the stone. -Started on clear liquid diet. Follow-up lipase for any pancreatitis, and LFTs tomorrow -Continue Supportive care with antiemetics, pain control, IV fluids. - Empirically started on Levaquin.  2. Abnormal LFTs-secondary to #1. -We will follow LFTs.  3. Essential hypertension-continue Cardizem, lisinopril.  4. GERD-continue Pepcid.  5. DVT prophylaxis on Lovenox   If Labs are improving, check with GI and possible discharge tomorrow    All the records are reviewed and case discussed with Care Management/Social Workerr. Management plans discussed with the patient, family and they are in agreement.  CODE STATUS: Full Code  TOTAL TIME TAKING CARE OF THIS PATIENT: 38 minutes.   POSSIBLE D/C IN 1-2 DAYS, DEPENDING ON CLINICAL CONDITION.   Enid Baas M.D on 08/16/2016 at 3:58 PM  Between 7am to 6pm  - Pager - [phone]  After 6pm go to [email or web address] - Social research officer, government  [facility]  Office  [phone]  CC: Primary care physician; Margaretann Loveless, PA-C

## 2016-08-16 NOTE — Transfer of Care (Signed)
Immediate Anesthesia Transfer of Care Note  Patient: Maurice Lee  Procedure(s) Performed: Procedure(s): ENDOSCOPIC RETROGRADE CHOLANGIOPANCREATOGRAPHY (ERCP) (N/A)  Patient Location: Endoscopy Unit  Anesthesia Type:General  Level of Consciousness: awake  Airway & Oxygen Therapy: Patient connected to nasal cannula oxygen  Post-op Assessment: Post -op Vital signs reviewed and stable  Post vital signs: stable  Last Vitals:  Vitals:   08/16/16 1321 08/16/16 1411  BP: 114/62 127/72  Pulse: 69 93  Resp: 18 17  Temp: 37.5 C     Last Pain:  Vitals:   08/16/16 1321  TempSrc: Tympanic  PainSc:       Patients Stated Pain Goal: 0 (08/15/16 2155)  Complications: No apparent anesthesia complications

## 2016-08-16 NOTE — Anesthesia Post-op Follow-up Note (Cosign Needed)
Anesthesia QCDR form completed.        

## 2016-08-16 NOTE — Anesthesia Preprocedure Evaluation (Addendum)
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)  Airway Mallampati: III  TM Distance: >3 FB Neck ROM: full    Dental  (+) Poor Dentition, Missing, Edentulous Lower, Edentulous Upper   Pulmonary neg shortness of breath, former smoker,    Pulmonary exam normal breath sounds clear to auscultation       Cardiovascular Exercise Tolerance: Good hypertension, (-) angina(-) Past MI and (-) DOE Normal cardiovascular exam Rhythm:regular Rate:Normal     Neuro/Psych  Neuromuscular disease negative psych ROS   GI/Hepatic negative GI ROS, Neg liver ROS,   Endo/Other  negative endocrine ROS  Renal/GU Renal disease  negative genitourinary   Musculoskeletal   Abdominal   Peds  Hematology negative hematology ROS (+)   Anesthesia Other Findings Patient is NPO appropriate and reports no nausea or vomiting today.  Past Medical History: No date: Complication of anesthesia No date: Hypertension No date: Pseudocholinesterase deficiency  Past Surgical History: 07/15/2016: COLONOSCOPY WITH PROPOFOL N/A     Comment: Procedure: COLONOSCOPY WITH PROPOFOL;                Surgeon: Wyline MoodKiran Anna, MD;  Location: [facility]               ENDOSCOPY;  Service: Endoscopy;  Laterality:               N/A; No date: Kidney stones removed  BMI    Body Mass Index:  33.17 kg/m      Reproductive/Obstetrics negative OB ROS                           Anesthesia Physical Anesthesia Plan  ASA: III  Anesthesia Plan: General   Post-op Pain Management:    Induction:   Airway Management Planned:   Additional Equipment:   Intra-op Plan:    Post-operative Plan:   Informed Consent: I have reviewed the patients History and Physical, chart, labs and discussed the procedure including the risks, benefits and alternatives for the proposed anesthesia with the patient or authorized representative who has indicated his/her understanding and acceptance.   Dental Advisory Given  Plan Discussed with: Anesthesiologist, CRNA and Surgeon  Anesthesia Plan Comments:         Anesthesia Quick Evaluation

## 2016-08-16 NOTE — Progress Notes (Signed)
Pt educated on MRCP, pt noted to have house arrest bracelet on ankle. Pt states that he can contact probation officer for possible removal in AM to allow for testing. Will keep NPO, Dr.Hugelmeyer notified.

## 2016-08-17 LAB — COMPREHENSIVE METABOLIC PANEL
ALBUMIN: 3.4 g/dL — AB (ref 3.5–5.0)
ALK PHOS: 180 U/L — AB (ref 38–126)
ALT: 188 U/L — AB (ref 17–63)
AST: 79 U/L — AB (ref 15–41)
Anion gap: 5 (ref 5–15)
BILIRUBIN TOTAL: 4.2 mg/dL — AB (ref 0.3–1.2)
BUN: 16 mg/dL (ref 6–20)
CALCIUM: 9 mg/dL (ref 8.9–10.3)
CO2: 30 mmol/L (ref 22–32)
CREATININE: 0.99 mg/dL (ref 0.61–1.24)
Chloride: 104 mmol/L (ref 101–111)
GFR calc Af Amer: >60 mL/min (ref >60)
GFR calc non Af Amer: >60 mL/min (ref >60)
GLUCOSE: 109 mg/dL — AB (ref 65–99)
Potassium: 3.6 mmol/L (ref 3.5–5.1)
SODIUM: 139 mmol/L (ref 135–145)
Total Protein: 6.5 g/dL (ref 6.5–8.1)

## 2016-08-17 LAB — LIPASE, BLOOD: Lipase: 24 U/L (ref 11–51)

## 2016-08-17 NOTE — Discharge Summary (Signed)
[facility] [place] [facility]   PATIENT NAME: Maurice Lee    MR#:  [id number]  DATE OF BIRTH:  Dec 18, 1960  DATE OF ADMISSION:  08/15/2016 ADMITTING PHYSICIAN: [place] SirenVivek J Sainani, MD  DATE OF DISCHARGE: 08/17/16  PRIMARY CARE PHYSICIAN: Margaretann LovelessJennifer M Burnette, PA-C    ADMISSION DIAGNOSIS:  Choledocholithiasis [K80.50] RUQ pain [R10.11]  DISCHARGE DIAGNOSIS:  Obstructive Juandice due to Choledocholithiasis s/p ERCP with stone removal  SECONDARY DIAGNOSIS:   Past Medical History:  Diagnosis Date  . Complication of anesthesia   . Hypertension   . Pseudocholinesterase deficiency     HOSPITAL COURSE:   56 year old male with past medical history of hypertension, osteoarthritis, GERD who presents to the hospital due to right upper quadrant abdominal pain and noted to have abnormal LFTs.  1. Right upper quadrant abdominal pain- secondary to choledocholithiasis.  - elevated LFTs alkaline phosphatase and bilirubin.  - CT scan is suggestive of intrauterine extrahepatic biliary ductal dilatation consistent with possible common bile duct issue.  - Patient G a consult. MRCP confirming a stone in the CBD. Status post ERCP and extraction of the stone on 08/16/16 -Started on clear liquid diet. Follow-up lipase normal   And LFTS improving -Continue Supportive care with antiemetics, pain control -doing very well. Requesting to go home  2. Abnormal LFTs-secondary to #1. -improving LFT's  3. Essential hypertension-continue Cardizem, lisinopril.  4. GERD-continue Pepcid.  5. DVT prophylaxis on Lovenox  Overall better d/c home CONSULTS OBTAINED:  Treatment Team:  Midge Miniumarren Wohl, MD Lacey JensenSteven Solik, MD  DRUG ALLERGIES:   Allergies  Allergen Reactions  . 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 Succinylcholine Other (See Comments)    Pseudocholinesterase deficiency - has  risk for prolonged apnea when given this drug  . Anesthesia S-I-40 [Propofol] Other (See Comments)    He won't wake up   . Sulfa Antibiotics Rash    DISCHARGE MEDICATIONS:   Current Discharge Medication List    CONTINUE these medications which have NOT CHANGED   Details  cephALEXin (KEFLEX) 250 MG capsule Take 250 mg by mouth 2 (two) times daily. Refills: 0    diltiazem (DILACOR XR) 240 MG 24 hr capsule Take 1 capsule (240 mg total) by mouth daily. Qty: 90 capsule, Refills: 0   Associated Diagnoses: Essential hypertension    lisinopril (PRINIVIL,ZESTRIL) 40 MG tablet Take 1 tablet (40 mg total) by mouth daily. Qty: 90 tablet, Refills: 3   Associated Diagnoses: Essential (primary) hypertension    naproxen (NAPROXEN DR) 500 MG EC tablet Take 1 tablet (500 mg total) by mouth 2 (two) times daily as needed. Qty: 60 tablet, Refills: 6   Associated Diagnoses: Primary osteoarthritis of left knee; Patellofemoral arthralgia of left knee    ranitidine (ZANTAC) 300 MG tablet Take 1 tablet (300 mg total) by mouth as needed. Qty: 90 tablet, Refills: 3   Associated Diagnoses: Gastroesophageal reflux disease, esophagitis presence not specified        If you experience worsening of your admission symptoms, develop shortness of breath, life threatening emergency, suicidal or homicidal thoughts you must seek medical attention immediately by calling 911 or calling your MD immediately  if symptoms less severe.  You Must read complete instructions/literature along with all the possible adverse reactions/side effects for all the Medicines you take and that have been prescribed to you. Take any new Medicines after you have completely understood and accept  all the possible adverse reactions/side effects.   Please note  You were cared for by a hospitalist during your hospital stay. If you have any questions about your discharge medications or the care you received while you were in the hospital after  you are discharged, you can call the unit and asked to speak with the hospitalist on call if the hospitalist that took care of you is not available. Once you are discharged, your primary care physician will handle any further medical issues. Please note that NO REFILLS for any discharge medications will be authorized once you are discharged, as it is imperative that you return to your primary care physician (or establish a relationship with a primary care physician if you do not have one) for your aftercare needs so that they can reassess your need for medications and monitor your lab values. Today   SUBJECTIVE   No complaints  VITAL SIGNS:  Blood pressure (!) 148/72, pulse 60, temperature 97.8 F (36.6 C), temperature source Oral, resp. rate 20, height 5\' 7"  (1.702 m), weight 96.1 kg (211 lb 12.8 oz), SpO2 99 %.  I/O:   Intake/Output Summary (Last 24 hours) at 08/17/16 1151 Last data filed at 08/17/16 1035  Gross per 24 hour  Intake              900 ml  Output             1500 ml  Net             -600 ml    PHYSICAL EXAMINATION:  GENERAL:  56 y.o.-year-old patient lying in the bed with no acute distress.  EYES: Pupils equal, round, reactive to light and accommodation. No scleral icterus. Extraocular muscles intact.  HEENT: Head atraumatic, normocephalic. Oropharynx and nasopharynx clear.  NECK:  Supple, no jugular venous distention. No thyroid enlargement, no tenderness.  LUNGS: Normal breath sounds bilaterally, no wheezing, rales,rhonchi or crepitation. No use of accessory muscles of respiration.  CARDIOVASCULAR: S1, S2 normal. No murmurs, rubs, or gallops.  ABDOMEN: Soft, non-tender, non-distended. Bowel sounds present. No organomegaly or mass.  EXTREMITIES: No pedal edema, cyanosis, or clubbing.  NEUROLOGIC: Cranial nerves II through XII are intact. Muscle strength 5/5 in all extremities. Sensation intact. Gait not checked.  PSYCHIATRIC: The patient is alert and oriented x 3.   SKIN: No obvious rash, lesion, or ulcer.   DATA REVIEW:   CBC   Recent Labs Lab 08/16/16 0703  WBC 6.0  HGB 14.0  HCT 40.1  PLT 236    Chemistries   Recent Labs Lab 08/17/16 0621  NA 139  K 3.6  CL 104  CO2 30  GLUCOSE 109*  BUN 16  CREATININE 0.99  CALCIUM 9.0  AST 79*  ALT 188*  ALKPHOS 180*  BILITOT 4.2*    Microbiology Results   No results found for this or any previous visit (from the past 240 hour(s)).  RADIOLOGY:  Ct Abdomen Pelvis W Contrast  Result Date: 08/15/2016 CLINICAL DATA:  Right upper quadrant abdominal pain and diarrhea for the past 4 days. Nausea. EXAM: CT ABDOMEN AND PELVIS WITH CONTRAST TECHNIQUE: Multidetector CT imaging of the abdomen and pelvis was performed using the standard protocol following bolus administration of intravenous contrast. CONTRAST:  ISOVUE-300 IOPAMIDOL (ISOVUE-300) INJECTION 61% COMPARISON:  07/18/2015. FINDINGS: Lower chest: 3 mm left lower lobe nodule on image number 10 of series 4. This is unchanged. Hepatobiliary: Interval mild intrahepatic and extrahepatic biliary ductal dilatation. The proximal common duct  measures 11.0 mm in maximum diameter. Poorly distended gallbladder. Pancreas: Unremarkable. No pancreatic ductal dilatation or surrounding inflammatory changes. Spleen: Normal in size without focal abnormality. Adrenals/Urinary Tract: Small bilateral renal calculi. The largest is in the upper pole on the left, measuring 5 mm. No bladder or ureteral calculi and no hydronephrosis. Normal appearing adrenal glands. Stomach/Bowel: Interval mild diffuse enlargement of the appendix with a maximum diameter of 8 mm. No periappendiceal soft tissue stranding. Interval 2 small metallic foreign bodies in the cecum. Mild medium and fat density wall thickening involving the cecum, not previously demonstrated. The cecum is currently under distended compared the previous examination. No pericecal soft tissue stranding. Remainder the  colon has a normal appearance, as do the small bowel and stomach. Vascular/Lymphatic: Mild aortic calcification. No enlarged lymph nodes. Reproductive: Prostate is unremarkable. Other: Small umbilical hernia containing fat. Musculoskeletal: Lumbar and lower thoracic spine degenerative changes. IMPRESSION: 1. Interval mild diffuse enlargement of the appendix without secondary signs of appendicitis. This could be normal for this patient or represent early changes of appendicitis. 2. Interval 2 small metallic foreign bodies in the cecum. These may represent ingested fragments of a dental filling. 3. Mild medium and fat density wall thickening involving the cecum this may represent changes due to current or previous cecal inflammation. 4. Interval mild intrahepatic and extrahepatic biliary ductal dilatation. This could be due to a nonvisualized distal common duct stone, stricture or mass. 5. Mild aortic atherosclerosis. Electronically Signed   By: Beckie Salts M.D.   On: 08/15/2016 14:15   Mr 3d Recon At Scanner  Result Date: 08/16/2016 CLINICAL DATA:  Right upper quadrant pain. Elevated liver function tests. Biliary duct dilatation on CT. EXAM: MRI ABDOMEN WITHOUT AND WITH CONTRAST (INCLUDING MRCP) TECHNIQUE: Multiplanar multisequence MR imaging of the abdomen was performed both before and after the administration of intravenous contrast. Heavily T2-weighted images of the biliary and pancreatic ducts were obtained, and three-dimensional MRCP images were rendered by post processing. CONTRAST:  20mL MULTIHANCE GADOBENATE DIMEGLUMINE 529 MG/ML IV SOLN COMPARISON:  CTs including 08/15/2016. FINDINGS: Lower chest: Normal heart size without pericardial or pleural effusion. Hepatobiliary: No focal liver lesion. Moderate intrahepatic biliary duct dilatation, including a an 8 mm left intrahepatic duct on image 8/series 8. Common duct is moderately dilated for age, including at 10 mm on image 12/series 8. Subtle distal  common duct stone measures on the order of 9 mm on image 21/ series 4 and image 19/series 10. Normal gallbladder, without evidence of cholecystitis. Pancreas:  Normal, without mass or ductal dilatation. Spleen:  Normal in size, without focal abnormality. Adrenals/Urinary Tract: Normal adrenal glands. Too small to characterize left renal lesions. Normal right kidney, without hydronephrosis. Stomach/Bowel: Normal stomach, without wall thickening. Normal abdominal bowel loops. Vascular/Lymphatic: Aortic atherosclerosis. No retroperitoneal or retrocrural adenopathy. Other:  No ascites. Musculoskeletal: No acute osseous abnormality. IMPRESSION: Moderate biliary duct dilatation with 9 mm distal common duct stone. Electronically Signed   By: Jeronimo Greaves M.D.   On: 08/16/2016 13:23   Mr Abdomen Mrcp Vivien Rossetti Contast  Result Date: 08/16/2016 CLINICAL DATA:  Right upper quadrant pain. Elevated liver function tests. Biliary duct dilatation on CT. EXAM: MRI ABDOMEN WITHOUT AND WITH CONTRAST (INCLUDING MRCP) TECHNIQUE: Multiplanar multisequence MR imaging of the abdomen was performed both before and after the administration of intravenous contrast. Heavily T2-weighted images of the biliary and pancreatic ducts were obtained, and three-dimensional MRCP images were rendered by post processing. CONTRAST:  20mL MULTIHANCE GADOBENATE DIMEGLUMINE 529 MG/ML  IV SOLN COMPARISON:  CTs including 08/15/2016. FINDINGS: Lower chest: Normal heart size without pericardial or pleural effusion. Hepatobiliary: No focal liver lesion. Moderate intrahepatic biliary duct dilatation, including a an 8 mm left intrahepatic duct on image 8/series 8. Common duct is moderately dilated for age, including at 10 mm on image 12/series 8. Subtle distal common duct stone measures on the order of 9 mm on image 21/ series 4 and image 19/series 10. Normal gallbladder, without evidence of cholecystitis. Pancreas:  Normal, without mass or ductal dilatation. Spleen:   Normal in size, without focal abnormality. Adrenals/Urinary Tract: Normal adrenal glands. Too small to characterize left renal lesions. Normal right kidney, without hydronephrosis. Stomach/Bowel: Normal stomach, without wall thickening. Normal abdominal bowel loops. Vascular/Lymphatic: Aortic atherosclerosis. No retroperitoneal or retrocrural adenopathy. Other:  No ascites. Musculoskeletal: No acute osseous abnormality. IMPRESSION: Moderate biliary duct dilatation with 9 mm distal common duct stone. Electronically Signed   By: Jeronimo Greaves M.D.   On: 08/16/2016 13:23   US Abdomen Limited Ruq  Result Date: 08/15/2016 CLINICAL DATA:  Right upper quadrant pain and tenderness for 4 days. Elevated liver function test. EXAM: US ABDOMEN LIMITED - RIGHT UPPER QUADRANT COMPARISON:  None. FINDINGS: Gallbladder: Gallbladder is incompletely distended, likely due to fact that patient was NPO for only 2 hours prior to exam. No gallstones identified. No evidence of gallbladder wall thickening. No sonographic Murphy sign noted by sonographer. Common bile duct: Diameter: 5-6 mm, within normal limits status post cholecystectomy. Liver: No focal lesion identified. Within normal limits in parenchymal echogenicity. IMPRESSION: Incompletely distended gallbladder, however no gallstones visualized. No evidence of biliary ductal dilatation or hepatic abnormality. Electronically Signed   By: Myles Rosenthal M.D.   On: 08/15/2016 13:45     Management plans discussed with the patient, family and they are in agreement.  CODE STATUS:     Code Status Orders        Start     Ordered   08/15/16 2101  Full code  Continuous     08/15/16 2100    Code Status History    Date Active Date Inactive Code Status Order ID Comments User Context   This patient has a current code status but no historical code status.      TOTAL TIME TAKING CARE OF THIS PATIENT: 40 minutes.    Tempest Frankland M.D on 08/17/2016 at 11:51 AM  Between 7am to 6pm  - Pager - [phone] After 6pm go to [email or web address] - password [email or web address] [facility]  [facility]  Office  [phone]  CC: Primary care physician; Margaretann Loveless, PA-C

## 2016-08-17 NOTE — Progress Notes (Signed)
[facility]    Clarisa SchoolsHoward Ritzel was admitted to the Hospital on 08/15/2016 and Discharged  08/17/2016 and should be excused from work/school   for 3 days starting 08/15/2016 , may return to work/school without any restrictions.  Call Enedina FinnerSona Almon Whitford MD, [facility]  [phone] with questions.  Miryam Mcelhinney M.D on 08/17/2016,at 1:53 PM

## 2016-08-19 ENCOUNTER — Encounter: Payer: Self-pay | Admitting: Gastroenterology

## 2016-08-19 ENCOUNTER — Telehealth: Payer: Self-pay

## 2016-08-19 NOTE — Telephone Encounter (Signed)
Transition Care Management Follow-up Telephone Call    Date discharged? 08/17/16  How have you been since you were released from the hospital? Recovering, pt is back at work.  Any patient concerns? none   Items Reviewed:  Medications reviewed: Yes  Allergies reviewed: Yes  Dietary changes reviewed: N/A  Referrals reviewed: N/A   Functional Questionnaire:  Independent - I Dependent - D    Activities of Daily Living (ADLs):    Personal hygiene - I Dressing - I Eating - I Maintaining continence - I Transferring - I   Independent Activities of Daily Living (iADLs): Basic communication skills - I Transportation - I Meal preparation  - I Shopping - I Housework - I Managing medications - I  Managing personal finances - I   Confirmed importance and date/time of follow-up visits scheduled YES  Provider Appointment booked with PCP for 08/21/16 @ 9 AM.  Confirmed with patient if condition begins to worsen call PCP or go to the ER.  Patient was given the office number and encouraged to call back with question or concerns: YES

## 2016-08-21 ENCOUNTER — Ambulatory Visit (INDEPENDENT_AMBULATORY_CARE_PROVIDER_SITE_OTHER): Payer: BLUE CROSS/BLUE SHIELD | Admitting: Physician Assistant

## 2016-08-21 ENCOUNTER — Encounter: Payer: Self-pay | Admitting: Physician Assistant

## 2016-08-21 VITALS — BP 130/70 | HR 71 | Temp 98.1°F | Resp 16 | Wt 216.6 lb

## 2016-08-21 DIAGNOSIS — K8050 Calculus of bile duct without cholangitis or cholecystitis without obstruction: Secondary | ICD-10-CM

## 2016-08-21 DIAGNOSIS — Z9189 Other specified personal risk factors, not elsewhere classified: Secondary | ICD-10-CM | POA: Diagnosis not present

## 2016-08-21 DIAGNOSIS — IMO0001 Reserved for inherently not codable concepts without codable children: Secondary | ICD-10-CM

## 2016-08-21 NOTE — Progress Notes (Signed)
Patient: Maurice Lee Male    DOB: 04-18-1961   56 y.o.   MRN: [id number] Visit Date: 08/21/2016  Today's Provider: Margaretann Loveless, PA-C   Chief Complaint  Patient presents with  . Hospitalization Follow-up   Subjective:    HPI  Follow up Hospitalization  Patient was admitted to [facility] on 08/15/16 and discharged on 08/17/16. He was treated for Choledocholithiasis. Treatment for this included ERCP/MRCP and was successful. Telephone follow up was done on 08/19/2016 by transition Care Management. He reports good compliance with treatment. He reports this condition is Improved. No symptoms since removal of the stone. ------------------------------------------------------------------------------------     Allergies  Allergen Reactions  . 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 Anesthesia S-I-40 [Propofol] Other (See Comments)    He won't wake up   . Sulfa Antibiotics Rash     Current Outpatient Prescriptions:  .  diltiazem (DILACOR XR) 240 MG 24 hr capsule, Take 1 capsule (240 mg total) by mouth daily., Disp: 90 capsule, Rfl: 0 .  lisinopril (PRINIVIL,ZESTRIL) 40 MG tablet, Take 1 tablet (40 mg total) by mouth daily., Disp: 90 tablet, Rfl: 3 .  naproxen (NAPROXEN DR) 500 MG EC tablet, Take 1 tablet (500 mg total) by mouth 2 (two) times daily as needed., Disp: 60 tablet, Rfl: 6 .  ranitidine (ZANTAC) 300 MG tablet, Take 1 tablet (300 mg total) by mouth as needed., Disp: 90 tablet, Rfl: 3  Review of Systems  Constitutional: Negative.   Respiratory: Negative.   Cardiovascular: Negative.   Gastrointestinal: Negative.   Neurological: Negative.   Psychiatric/Behavioral: Negative.     Social History  Substance Use Topics  . Smoking status: Former Smoker      Packs/day: 1.50    Years: 40.00  . Smokeless tobacco: Never Used  . Alcohol use No   Objective:   BP 130/70 (BP Location: Left Arm, Patient Position: Sitting, Cuff Size: Normal)   Pulse 71   Temp 98.1 F (36.7 C) (Oral)   Resp 16   Wt 216 lb 9.6 oz (98.2 kg)   SpO2 97%   BMI 33.92 kg/m   Physical Exam  Constitutional: He is oriented to person, place, and time. He appears well-developed and well-nourished. No distress.  Cardiovascular: Normal rate, regular rhythm and normal heart sounds.  Exam reveals no gallop and no friction rub.   No murmur heard. Pulmonary/Chest: Effort normal and breath sounds normal. No respiratory distress. He has no wheezes. He has no rales.  Abdominal: Soft. Normal appearance and bowel sounds are normal. He exhibits no distension and no mass. There is no hepatosplenomegaly. There is no tenderness. There is no rebound, no guarding and no CVA tenderness.  Neurological: He is alert and oriented to person, place, and time.  Skin: Skin is warm and dry. He is not diaphoretic.  Vitals reviewed.     Assessment & Plan:     1. Choledocholithiasis Improved. Patient did have an episode a few months back that was felt may have been cardiac in nature that occurred after eating at his work where he became very flushed, nauseous, and as if he may pass out. He lay on the floor for 30 minutes and symptoms improved. Cardiac workup was completely normal. I feel this may have actually  been an episode of choledocholithiasis as it occurred right after eating. Hospital records were reviewed and he has a scheduled f/u with Dr. Tobi BastosAnna for colonoscopy.  2. Transition of care performed with sharing of clinical summary        Margaretann LovelessJennifer M Addis Bennie, PA-C  [facility]

## 2016-08-21 NOTE — Patient Instructions (Signed)
Fat and Cholesterol Restricted Diet High levels of fat and cholesterol in your blood may lead to various health problems, such as diseases of the heart, blood vessels, gallbladder, liver, and pancreas. Fats are concentrated sources of energy that come in various forms. Certain types of fat, including saturated fat, may be harmful in excess. Cholesterol is a substance needed by your body in small amounts. Your body makes all the cholesterol it needs. Excess cholesterol comes from the food you eat. When you have high levels of cholesterol and saturated fat in your blood, health problems can develop because the excess fat and cholesterol will gather along the walls of your blood vessels, causing them to narrow. Choosing the right foods will help you control your intake of fat and cholesterol. This will help keep the levels of these substances in your blood within normal limits and reduce your risk of disease. What is my plan? Your health care provider recommends that you:  Limit your fat intake to ______% or less of your total calories per day.  Limit the amount of cholesterol in your diet to less than _________mg per day.  Eat 20-30 grams of fiber each day.  What types of fat should I choose?  Choose healthy fats more often. Choose monounsaturated and polyunsaturated fats, such as olive and canola oil, flaxseeds, walnuts, almonds, and seeds.  Eat more omega-3 fats. Good choices include salmon, mackerel, sardines, tuna, flaxseed oil, and ground flaxseeds. Aim to eat fish at least two times a week.  Limit saturated fats. Saturated fats are primarily found in animal products, such as meats, butter, and cream. Plant sources of saturated fats include palm oil, palm kernel oil, and coconut oil.  Avoid foods with partially hydrogenated oils in them. These contain trans fats. Examples of foods that contain trans fats are stick margarine, some tub margarines, cookies, crackers, and other baked goods. What  general guidelines do I need to follow? These guidelines for healthy eating will help you control your intake of fat and cholesterol:  Check food labels carefully to identify foods with trans fats or high amounts of saturated fat.  Fill one half of your plate with vegetables and green salads.  Fill one fourth of your plate with whole grains. Look for the word "whole" as the first word in the ingredient list.  Fill one fourth of your plate with lean protein foods.  Limit fruit to two servings a day. Choose fruit instead of juice.  Eat more foods that contain fiber, such as apples, broccoli, carrots, beans, peas, and barley.  Eat more home-cooked food and less restaurant, buffet, and fast food.  Limit or avoid alcohol.  Limit foods high in starch and sugar.  Limit fried foods.  Cook foods using methods other than frying. Baking, boiling, grilling, and broiling are all great options.  Lose weight if you are overweight. Losing just 5-10% of your initial body weight can help your overall health and prevent diseases such as diabetes and heart disease.  What foods can I eat? Grains  Whole grains, such as whole wheat or whole grain breads, crackers, cereals, and pasta. Unsweetened oatmeal, bulgur, barley, quinoa, or brown rice. Corn or whole wheat flour tortillas. Vegetables  Fresh or frozen vegetables (raw, steamed, roasted, or grilled). Green salads. Fruits  All fresh, canned (in natural juice), or frozen fruits. Meats and other protein foods  Ground beef (85% or leaner), grass-fed beef, or beef trimmed of fat. Skinless chicken or turkey. Ground chicken or turkey.   Pork trimmed of fat. All fish and seafood. Eggs. Dried beans, peas, or lentils. Unsalted nuts or seeds. Unsalted canned or dry beans. Dairy  Low-fat dairy products, such as skim or 1% milk, 2% or reduced-fat cheeses, low-fat ricotta or cottage cheese, or plain low-fat yo Fats and oils  Tub margarines without trans  fats. Light or reduced-fat mayonnaise and salad dressings. Avocado. Olive, canola, sesame, or safflower oils. Natural peanut or almond butter (choose ones without added sugar and oil). The items listed above may not be a complete list of recommended foods or beverages. Contact your dietitian for more options. Foods to avoid Grains  White bread. White pasta. White rice. Cornbread. Bagels, pastries, and croissants. Crackers that contain trans fat. Vegetables  White potatoes. Corn. Creamed or fried vegetables. Vegetables in a cheese sauce. Fruits  Dried fruits. Canned fruit in light or heavy syrup. Fruit juice. Meats and other protein foods  Fatty cuts of meat. Ribs, chicken wings, bacon, sausage, bologna, salami, chitterlings, fatback, hot dogs, bratwurst, and packaged luncheon meats. Liver and organ meats. Dairy  Whole or 2% milk, cream, half-and-half, and cream cheese. Whole milk cheeses. Whole-fat or sweetened yogurt. Full-fat cheeses. Nondairy creamers and whipped toppings. Processed cheese, cheese spreads, or cheese curds. Beverages  Alcohol. Sweetened drinks (such as sodas, lemonade, and fruit drinks or punches). Fats and oils  Butter, stick margarine, lard, shortening, ghee, or bacon fat. Coconut, palm kernel, or palm oils. Sweets and desserts  Corn syrup, sugars, honey, and molasses. Candy. Jam and jelly. Syrup. Sweetened cereals. Cookies, pies, cakes, donuts, muffins, and ice cream. The items listed above may not be a complete list of foods and beverages to avoid. Contact your dietitian for more information. This information is not intended to replace advice given to you by your health care provider. Make sure you discuss any questions you have with your health care provider. Document Released: 07/15/2005 Document Revised: 08/05/2014 Document Reviewed: 10/13/2013 Elsevier Interactive Patient Education  2017 Elsevier Inc.  

## 2016-09-13 ENCOUNTER — Telehealth: Payer: Self-pay | Admitting: Physician Assistant

## 2016-09-13 DIAGNOSIS — J111 Influenza due to unidentified influenza virus with other respiratory manifestations: Secondary | ICD-10-CM

## 2016-09-13 MED ORDER — OSELTAMIVIR PHOSPHATE 75 MG PO CAPS: 75.0000 mg | ORAL_CAPSULE | Freq: Two times a day (BID) | ORAL | 0 refills | Status: DC

## 2016-09-13 NOTE — Telephone Encounter (Signed)
Please review and advise. KW 

## 2016-09-13 NOTE — Telephone Encounter (Signed)
Pt advised-aa 

## 2016-09-13 NOTE — Telephone Encounter (Signed)
Tamiflu sent in

## 2016-09-13 NOTE — Telephone Encounter (Signed)
Pt's wife was in Monday with the flu now he is having symptoms.  He wants to know if you will call in Tamilflu,  He [facility] [employer]  Thank sTer

## 2016-09-17 ENCOUNTER — Encounter: Payer: Self-pay | Admitting: Physician Assistant

## 2016-09-17 ENCOUNTER — Ambulatory Visit (INDEPENDENT_AMBULATORY_CARE_PROVIDER_SITE_OTHER): Payer: BLUE CROSS/BLUE SHIELD | Admitting: Physician Assistant

## 2016-09-17 VITALS — BP 150/80 | HR 83 | Temp 98.1°F | Resp 16 | Wt 214.4 lb

## 2016-09-17 DIAGNOSIS — J069 Acute upper respiratory infection, unspecified: Secondary | ICD-10-CM | POA: Diagnosis not present

## 2016-09-17 DIAGNOSIS — M25562 Pain in left knee: Secondary | ICD-10-CM | POA: Diagnosis not present

## 2016-09-17 DIAGNOSIS — R05 Cough: Secondary | ICD-10-CM

## 2016-09-17 DIAGNOSIS — I10 Essential (primary) hypertension: Secondary | ICD-10-CM | POA: Diagnosis not present

## 2016-09-17 DIAGNOSIS — M1712 Unilateral primary osteoarthritis, left knee: Secondary | ICD-10-CM | POA: Diagnosis not present

## 2016-09-17 DIAGNOSIS — R059 Cough, unspecified: Secondary | ICD-10-CM

## 2016-09-17 MED ORDER — AMOXICILLIN-POT CLAVULANATE 875-125 MG PO TABS: 1.0000 | ORAL_TABLET | Freq: Two times a day (BID) | ORAL | 0 refills | Status: DC

## 2016-09-17 MED ORDER — NAPROXEN 500 MG PO TBEC: 500.0000 mg | DELAYED_RELEASE_TABLET | Freq: Two times a day (BID) | ORAL | 6 refills | Status: DC | PRN

## 2016-09-17 MED ORDER — PROMETHAZINE-DM 6.25-15 MG/5ML PO SYRP
5.0000 mL | ORAL_SOLUTION | Freq: Four times a day (QID) | ORAL | 0 refills | Status: DC | PRN
Start: 2016-09-17 — End: 2017-01-03

## 2016-09-17 NOTE — Progress Notes (Signed)
Patient: Maurice Lee Male    DOB: 09-10-1960   56 y.o.   MRN: [id number] Visit Date: 09/17/2016  Today's Provider: Margaretann LovelessJennifer M Jeneen Doutt, PA-C   Chief Complaint  Patient presents with  . Follow-up    HTN   Subjective:    HPI  Hypertension, follow-up:  BP Readings from Last 3 Encounters:  09/17/16 (!) 150/80  08/21/16 130/70  08/17/16 (!) 148/72    He was last seen for hypertension 3 months ago.  He reports excellent compliance with treatment. He is not having side effects.  He is exercising. He is adherent to low salt diet.   He is experiencing none.  Patient denies chest pain, fatigue, irregular heart beat, lower extremity edema and palpitations.      Weight trend: stable Wt Readings from Last 3 Encounters:  09/17/16 214 lb 6.4 oz (97.3 kg)  08/21/16 216 lb 9.6 oz (98.2 kg)  08/15/16 211 lb 12.8 oz (96.1 kg)   ------------------------------------------------------------------------ URI: Complains of sinus pressure, rhinorrhea, post nasal drainage, cough. No fevers.    Allergies  Allergen Reactions  . 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 Anesthesia S-I-40 [Propofol] Other (See Comments)    He won't wake up   . Sulfa Antibiotics Rash     Current Outpatient Prescriptions:  .  diltiazem (DILACOR XR) 240 MG 24 hr capsule, Take 1 capsule (240 mg total) by mouth daily., Disp: 90 capsule, Rfl: 0 .  lisinopril (PRINIVIL,ZESTRIL) 40 MG tablet, Take 1 tablet (40 mg total) by mouth daily., Disp: 90 tablet, Rfl: 3 .  naproxen (NAPROXEN DR) 500 MG EC tablet, Take 1 tablet (500 mg total) by mouth 2 (two) times daily as needed., Disp: 60 tablet, Rfl: 6 .  ranitidine (ZANTAC) 300 MG tablet, Take 1 tablet (300 mg total) by mouth as needed., Disp: 90 tablet,  Rfl: 3 .  oseltamivir (TAMIFLU) 75 MG capsule, Take 1 capsule (75 mg total) by mouth 2 (two) times daily. (Patient not taking: Reported on 09/17/2016), Disp: 10 capsule, Rfl: 0  Review of Systems  Constitutional: Negative.  Negative for fatigue and fever.  HENT: Positive for congestion, postnasal drip, rhinorrhea and sinus pressure. Negative for ear pain, sinus pain, sore throat, tinnitus and trouble swallowing.   Respiratory: Positive for cough. Negative for chest tightness, shortness of breath and wheezing.   Cardiovascular: Negative.   Gastrointestinal: Negative for abdominal pain and nausea.  Musculoskeletal: Negative.   Neurological: Negative.     Social History  Substance Use Topics  . Smoking status: Former Smoker    Packs/day: 1.50    Years: 40.00  . Smokeless tobacco: Never Used  . Alcohol use No   Objective:   BP (!) 150/80 (BP Location: Right Arm, Patient Position: Sitting, Cuff Size: Large)   Pulse 83   Temp 98.1 F (36.7 C) (Oral)   Resp 16   Wt 214 lb 6.4 oz (97.3 kg)   SpO2 97%   BMI 33.58 kg/m   Physical Exam  Constitutional: He appears well-developed and well-nourished. No distress.  HENT:  Head: Normocephalic and atraumatic.  Right Ear: Hearing, tympanic membrane, external ear and ear canal normal. Tympanic membrane is not erythematous and not bulging. No middle ear effusion.  Left Ear: Hearing, tympanic membrane, external ear  and ear canal normal. Tympanic membrane is not erythematous and not bulging.  No middle ear effusion.  Nose: Mucosal edema and rhinorrhea present. Right sinus exhibits no maxillary sinus tenderness and no frontal sinus tenderness. Left sinus exhibits no maxillary sinus tenderness and no frontal sinus tenderness.  Mouth/Throat: Uvula is midline, oropharynx is clear and moist and mucous membranes are normal. No oropharyngeal exudate, posterior oropharyngeal edema or posterior oropharyngeal erythema.  Eyes: Conjunctivae and EOM are normal.  Pupils are equal, round, and reactive to light. Right eye exhibits no discharge. Left eye exhibits no discharge.  Neck: Normal range of motion. Neck supple. No tracheal deviation present. No Brudzinski's sign and no Kernig's sign noted. No thyromegaly present.  Cardiovascular: Normal rate, regular rhythm and normal heart sounds.  Exam reveals no gallop and no friction rub.   No murmur heard. Pulmonary/Chest: Effort normal and breath sounds normal. No stridor. No respiratory distress. He has no wheezes. He has no rales.  Musculoskeletal: He exhibits no edema.  Lymphadenopathy:    He has no cervical adenopathy.  Skin: Skin is warm and dry. He is not diaphoretic.  Vitals reviewed.     Assessment & Plan:     1. Essential (primary) hypertension Elevated today most likely from patient not feeling well and he also admits to drinking a [employer] energy drink at work (works 3rd shift). I will see him back in 4 months to recheck and check labs.   2. Upper respiratory tract infection, unspecified type Worsening symptoms that have not responded to OTC medications. Will give augmentin as below. Continue Mucinex for congestion. Stay well hydrated and get plenty of rest. Call if no symptom improvement or if symptoms worsen. - amoxicillin-clavulanate (AUGMENTIN) 875-125 MG tablet; Take 1 tablet by mouth 2 (two) times daily.  Dispense: 20 tablet; Refill: 0  3. Cough Promethazine DM given for nighttime cough. - promethazine-dextromethorphan (PROMETHAZINE-DM) 6.25-15 MG/5ML syrup; Take 5 mLs by mouth 4 (four) times daily as needed for cough.  Dispense: 180 mL; Refill: 0  4. Primary osteoarthritis of left knee Stable. Diagnosis pulled for medication refill. Continue current medical treatment plan. - naproxen (NAPROXEN DR) 500 MG EC tablet; Take 1 tablet (500 mg total) by mouth 2 (two) times daily as needed.  Dispense: 60 tablet; Refill: 6  5. Patellofemoral arthralgia of left knee Stable. Diagnosis pulled  for medication refill. Continue current medical treatment plan. - naproxen (NAPROXEN DR) 500 MG EC tablet; Take 1 tablet (500 mg total) by mouth 2 (two) times daily as needed.  Dispense: 60 tablet; Refill: 6       Margaretann Loveless, PA-C  [facility]

## 2016-09-17 NOTE — Patient Instructions (Signed)
DASH Eating Plan DASH stands for "Dietary Approaches to Stop Hypertension." The DASH eating plan is a healthy eating plan that has been shown to reduce high blood pressure (hypertension). Additional health benefits may include reducing the risk of type 2 diabetes mellitus, heart disease, and stroke. The DASH eating plan may also help with weight loss. What do I need to know about the DASH eating plan? For the DASH eating plan, you will follow these general guidelines:  Choose foods with less than 150 milligrams of sodium per serving (as listed on the food label).  Use salt-free seasonings or herbs instead of table salt or sea salt.  Check with your health care provider or pharmacist before using salt substitutes.  Eat lower-sodium products. These are often labeled as "low-sodium" or "no salt added."  Eat fresh foods. Avoid eating a lot of canned foods.  Eat more vegetables, fruits, and low-fat dairy products.  Choose whole grains. Look for the word "whole" as the first word in the ingredient list.  Choose fish and skinless chicken or turkey more often than red meat. Limit fish, poultry, and meat to 6 oz (170 g) each day.  Limit sweets, desserts, sugars, and sugary drinks.  Choose heart-healthy fats.  Eat more home-cooked food and less restaurant, buffet, and fast food.  Limit fried foods.  Do not fry foods. Cook foods using methods such as baking, boiling, grilling, and broiling instead.  When eating at a restaurant, ask that your food be prepared with less salt, or no salt if possible. What foods can I eat? Seek help from a dietitian for individual calorie needs. Grains  Whole grain or whole wheat bread. Brown rice. Whole grain or whole wheat pasta. Quinoa, bulgur, and whole grain cereals. Low-sodium cereals. Corn or whole wheat flour tortillas. Whole grain cornbread. Whole grain crackers. Low-sodium crackers. Vegetables  Fresh or frozen vegetables (raw, steamed, roasted, or  grilled). Low-sodium or reduced-sodium tomato and vegetable juices. Low-sodium or reduced-sodium tomato sauce and paste. Low-sodium or reduced-sodium canned vegetables. Fruits  All fresh, canned (in natural juice), or frozen fruits. Meat and Other Protein Products  Ground beef (85% or leaner), grass-fed beef, or beef trimmed of fat. Skinless chicken or turkey. Ground chicken or turkey. Pork trimmed of fat. All fish and seafood. Eggs. Dried beans, peas, or lentils. Unsalted nuts and seeds. Unsalted canned beans. Dairy  Low-fat dairy products, such as skim or 1% milk, 2% or reduced-fat cheeses, low-fat ricotta or cottage cheese, or plain low-fat yogurt. Low-sodium or reduced-sodium cheeses. Fats and Oils  Tub margarines without trans fats. Light or reduced-fat mayonnaise and salad dressings (reduced sodium). Avocado. Safflower, olive, or canola oils. Natural peanut or almond butter. Other  Unsalted popcorn and pretzels. The items listed above may not be a complete list of recommended foods or beverages. Contact your dietitian for more options.  What foods are not recommended? Grains  White bread. White pasta. White rice. Refined cornbread. Bagels and croissants. Crackers that contain trans fat. Vegetables  Creamed or fried vegetables. Vegetables in a cheese sauce. Regular canned vegetables. Regular canned tomato sauce and paste. Regular tomato and vegetable juices. Fruits  Canned fruit in light or heavy syrup. Fruit juice. Meat and Other Protein Products  Fatty cuts of meat. Ribs, chicken wings, bacon, sausage, bologna, salami, chitterlings, fatback, hot dogs, bratwurst, and packaged luncheon meats. Salted nuts and seeds. Canned beans with salt. Dairy  Whole or 2% milk, cream, half-and-half, and cream cheese. Whole-fat or sweetened yogurt. Full-fat cheeses   or blue cheese. Nondairy creamers and whipped toppings. Processed cheese, cheese spreads, or cheese curds. Condiments  Onion and garlic  salt, seasoned salt, table salt, and sea salt. Canned and packaged gravies. Worcestershire sauce. Tartar sauce. Barbecue sauce. Teriyaki sauce. Soy sauce, including reduced sodium. Steak sauce. Fish sauce. Oyster sauce. Cocktail sauce. Horseradish. Ketchup and mustard. Meat flavorings and tenderizers. Bouillon cubes. Hot sauce. Tabasco sauce. Marinades. Taco seasonings. Relishes. Fats and Oils  Butter, stick margarine, lard, shortening, ghee, and bacon fat. Coconut, palm kernel, or palm oils. Regular salad dressings. Other  Pickles and olives. Salted popcorn and pretzels. The items listed above may not be a complete list of foods and beverages to avoid. Contact your dietitian for more information.  Where can I find more information? National Heart, Lung, and [employer] Institute: www.nhlbi.nih.gov/health/health-topics/topics/dash/ This information is not intended to replace advice given to you by your health care provider. Make sure you discuss any questions you have with your health care provider. Document Released: 07/04/2011 Document Revised: 12/21/2015 Document Reviewed: 05/19/2013 Elsevier Interactive Patient Education  2017 Elsevier Inc.  

## 2016-11-25 ENCOUNTER — Other Ambulatory Visit: Payer: Self-pay | Admitting: Physician Assistant

## 2016-11-25 DIAGNOSIS — I10 Essential (primary) hypertension: Secondary | ICD-10-CM

## 2017-01-03 ENCOUNTER — Ambulatory Visit (INDEPENDENT_AMBULATORY_CARE_PROVIDER_SITE_OTHER): Payer: BLUE CROSS/BLUE SHIELD | Admitting: Physician Assistant

## 2017-01-03 ENCOUNTER — Encounter: Payer: Self-pay | Admitting: Physician Assistant

## 2017-01-03 VITALS — BP 140/60 | HR 68 | Temp 97.5°F | Resp 16 | Wt 221.0 lb

## 2017-01-03 DIAGNOSIS — M1712 Unilateral primary osteoarthritis, left knee: Secondary | ICD-10-CM | POA: Diagnosis not present

## 2017-01-03 MED ORDER — METHYLPREDNISOLONE ACETATE 80 MG/ML IJ SUSP
80.0000 mg | Freq: Once | INTRAMUSCULAR | Status: AC
  Administered 2017-01-03: 80 mg via INTRA_ARTICULAR

## 2017-01-03 NOTE — Patient Instructions (Signed)
Knee Injection, Care After  Refer to this sheet in the next few weeks. These instructions provide you with information about caring for yourself after your procedure. Your health care provider may also give you more specific instructions. Your treatment has been planned according to current medical practices, but problems sometimes occur. Call your health care provider if you have any problems or questions after your procedure.  What can I expect after the procedure?  After the procedure, it is common to have:   Soreness.   Warmth.   Swelling.    You may have more pain, swelling, and warmth than you did before the injection. This reaction may last for about one day.  Follow these instructions at home:  Bathing   If you were given a bandage (dressing), keep it dry until your health care provider says it can be removed. Ask your health care provider when you can start showering or taking a bath.  Managing pain, stiffness, and swelling   If directed, apply ice to the injection area:  ? Put ice in a plastic bag.  ? Place a towel between your skin and the bag.  ? Leave the ice on for 20 minutes, 2-3 times per day.   Do not apply heat to your knee.   Raise the injection area above the level of your heart while you are sitting or lying down.  Activity   Avoid strenuous activities for as long as directed by your health care provider. Ask your health care provider when you can return to your normal activities.  General instructions   Take medicines only as directed by your health care provider.   Do not take aspirin or other over-the-counter medicines unless your health care provider says you can.   Check your injection site every day for signs of infection. Watch for:  ? Redness, swelling, or pain.  ? Fluid, blood, or pus.   Follow your health care provider's instructions about dressing changes and removal.  Contact a health care provider if:   You have symptoms at your injection site that last longer than  two days after your procedure.   You have redness, swelling, or pain in your injection area.   You have fluid, blood, or pus coming from your injection site.   You have warmth in your injection area.   You have a fever.   Your pain is not controlled with medicine.  Get help right away if:   Your knee turns very red.   Your knee becomes very swollen.   Your knee pain is severe.  This information is not intended to replace advice given to you by your health care provider. Make sure you discuss any questions you have with your health care provider.  Document Released: 08/05/2014 Document Revised: 03/20/2016 Document Reviewed: 05/25/2014  Elsevier Interactive Patient Education  2018 Elsevier Inc.

## 2017-01-03 NOTE — Progress Notes (Signed)
Patient: Maurice Lee Male    DOB: 1961/05/23   56 y.o.   MRN: [id number] Visit Date: 01/03/2017  Today's Provider: Margaretann Loveless, PA-C   Chief Complaint  Patient presents with  . Knee Pain   Subjective:    Knee Pain   The pain is present in the left knee (This knee pain is a chronic problem, that has reoccurred about 1 month ago. The pain has been gradually worsening.). The pain is at a severity of 4/10 (pt reports it can get up to a 10/10). The pain is moderate. Pertinent negatives include no inability to bear weight, loss of motion, loss of sensation, muscle weakness, numbness or tingling. The symptoms are aggravated by movement. He has tried NSAIDs for the symptoms. The treatment provided no relief.      Allergies  Allergen Reactions  . 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 Anesthesia S-I-40 [Propofol] Other (See Comments)    He won't wake up   . Sulfa Antibiotics Rash     Current Outpatient Prescriptions:  .  diltiazem (DILACOR XR) 240 MG 24 hr capsule, Take 1 capsule (240 mg total) by mouth daily., Disp: 90 capsule, Rfl: 0 .  ferrous sulfate 325 (65 FE) MG tablet, Take 325 mg by mouth daily with breakfast., Disp: , Rfl:  .  lisinopril (PRINIVIL,ZESTRIL) 40 MG tablet, take 1 tablet by mouth once daily, Disp: 90 tablet, Rfl: 3 .  naproxen (NAPROXEN DR) 500 MG EC tablet, Take 1 tablet (500 mg total) by mouth 2 (two) times daily as needed., Disp: 60 tablet, Rfl: 6 .  ranitidine (ZANTAC) 300 MG tablet, Take 1 tablet (300 mg total) by mouth as needed., Disp: 90 tablet, Rfl: 3  Review of Systems  Constitutional: Negative.   Respiratory: Negative.   Cardiovascular: Negative.   Gastrointestinal: Negative.   Musculoskeletal: Positive for arthralgias.  Negative for gait problem and joint swelling.  Neurological: Negative for tingling and numbness.    Social History  Substance Use Topics  . Smoking status: Former Smoker    Packs/day: 1.50    Years: 40.00  . Smokeless tobacco: Never Used  . Alcohol use No   Objective:   BP 140/60 (BP Location: Left Arm, Patient Position: Sitting, Cuff Size: Large)   Pulse 68   Temp 97.5 F (36.4 C) (Oral)   Resp 16   Wt 221 lb (100.2 kg)   BMI 34.61 kg/m  Vitals:   01/03/17 1016  BP: 140/60  Pulse: 68  Resp: 16  Temp: 97.5 F (36.4 C)  TempSrc: Oral  Weight: 221 lb (100.2 kg)     Physical Exam  Constitutional: He appears well-developed and well-nourished. No distress.  HENT:  Head: Normocephalic and atraumatic.  Neck: Normal range of motion. Neck supple. No JVD present. No tracheal deviation present. No thyromegaly present.  Cardiovascular: Normal rate, regular rhythm and normal heart sounds.  Exam reveals no gallop and no friction rub.   No murmur heard. Pulmonary/Chest: Effort normal and breath sounds normal. No respiratory distress. He has no wheezes. He has no rales.  Musculoskeletal:       Left knee: He exhibits swelling and bony tenderness. He exhibits normal range of motion, no effusion, normal alignment, no LCL laxity, normal patellar mobility, normal  meniscus and no MCL laxity. Tenderness found. Medial joint line and lateral joint line tenderness noted.  Lymphadenopathy:    He has no cervical adenopathy.  Skin: He is not diaphoretic.  Vitals reviewed.      Assessment & Plan:     1. Primary osteoarthritis of left knee Cortisone injection given without complication. See procedure note below.  - methylPREDNISolone acetate (DEPO-MEDROL) injection 80 mg; Inject 1 mL (80 mg total) into the articular space once.  Procedure Note: Benefits, risks (including infection, tattooing, adipose dimpling, and tendon rupture) and alternatives were explained to the patient. All questions  were sought and answered.  Patient agreed to continue and verbal consent was obtained.   An aspiration and steroid injection was performed on left knee using 4cc of 1% plain Xyloocaine and 80 mg of depo-medrol. There was no bleeding. Hemostasis was intact. A dry dressing was applied. The procedure was well tolerated.       Margaretann LovelessJennifer M Sophiagrace Benbrook, PA-C  [facility]

## 2017-01-14 ENCOUNTER — Telehealth: Payer: Self-pay | Admitting: Physician Assistant

## 2017-01-14 ENCOUNTER — Encounter: Payer: Self-pay | Admitting: Physician Assistant

## 2017-01-14 ENCOUNTER — Ambulatory Visit (INDEPENDENT_AMBULATORY_CARE_PROVIDER_SITE_OTHER): Payer: BLUE CROSS/BLUE SHIELD | Admitting: Physician Assistant

## 2017-01-14 VITALS — BP 136/62 | HR 58 | Temp 98.2°F | Resp 16 | Wt 215.0 lb

## 2017-01-14 DIAGNOSIS — M1712 Unilateral primary osteoarthritis, left knee: Secondary | ICD-10-CM | POA: Diagnosis not present

## 2017-01-14 MED ORDER — DICLOFENAC SODIUM 1 % TD GEL: 4.0000 g | Freq: Four times a day (QID) | TRANSDERMAL | 3 refills | Status: DC

## 2017-01-14 NOTE — Telephone Encounter (Signed)
Pt wife states the Rx for diclofenac sodium (VOLTAREN) 1 % GEL is not covered by insurance.  Pt wife is asking if something different can be called in.  [facility] [employer] [address]  [phone]#[phone]/MW

## 2017-01-14 NOTE — Progress Notes (Signed)
Patient: Maurice Lee Male    DOB: 08/21/1960   56 y.o.   MRN: [id number] Visit Date: 01/14/2017  Today's Provider: Margaretann Loveless, PA-C   Chief Complaint  Patient presents with  . Knee Pain   Subjective:    HPI Patient comes in today c/o knee pain. He reports that the pain is located in his left knee. He was seen in the office on 01/03/17 and was treated with a cortisone injection. He reports that this helped, but the swelling and pain has returned. He denies any redness, warmth or fever. 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 He was at work on Saturday when he noticed his knee starting to worsen again. He reports Saturday night his knee was very swollen and tight in the popliteal region.     Allergies  Allergen Reactions  . 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 Anesthesia S-I-40 [Propofol] Other (See Comments)    He won't wake up   . Sulfa Antibiotics Rash     Current Outpatient Prescriptions:  .  diltiazem (DILACOR XR) 240 MG 24 hr capsule, Take 1 capsule (240 mg total) by mouth daily., Disp: 90 capsule, Rfl: 0 .  ferrous sulfate 325 (65 FE) MG tablet, Take 325 mg by mouth daily with breakfast., Disp: , Rfl:  .  lisinopril (PRINIVIL,ZESTRIL) 40 MG tablet, take 1 tablet by mouth once daily, Disp: 90 tablet, Rfl: 3 .  naproxen (NAPROXEN DR) 500 MG EC tablet, Take 1 tablet (500 mg total) by mouth 2 (two) times daily as needed., Disp: 60 tablet, Rfl: 6 .  ranitidine (ZANTAC) 300 MG tablet, Take 1 tablet (300 mg total) by mouth as needed., Disp: 90 tablet, Rfl: 3  Review of Systems  Constitutional: Negative.   Respiratory: Negative.   Cardiovascular: Negative.   Gastrointestinal: Negative.   Musculoskeletal: Positive for  arthralgias and joint swelling.       Left knee, swelling comes and goes  Neurological: Negative for weakness and numbness.    Social History  Substance Use Topics  . Smoking status: Former Smoker    Packs/day: 1.50    Years: 40.00  . Smokeless tobacco: Never Used  . Alcohol use No   Objective:   BP 136/62 (BP Location: Right Arm, Patient Position: Sitting, Cuff Size: Normal)   Pulse (!) 58   Temp 98.2 F (36.8 C)   Resp 16   Wt 215 lb (97.5 kg)   SpO2 96%   BMI 33.67 kg/m  Vitals:   01/14/17 0856  BP: 136/62  Pulse: (!) 58  Resp: 16  Temp: 98.2 F (36.8 C)  SpO2: 96%  Weight: 215 lb (97.5 kg)     Physical Exam  Constitutional: He appears well-developed and well-nourished. No distress.  HENT:  Head: Normocephalic and atraumatic.  Cardiovascular: Normal rate, regular rhythm and normal heart sounds.  Exam reveals no gallop and no friction rub.   No murmur heard. Pulmonary/Chest: Effort normal and breath sounds normal. No respiratory distress. He has no wheezes. He has no rales.  Musculoskeletal:       Left knee: He exhibits swelling. He exhibits normal range of motion, no effusion, no erythema, normal alignment, no LCL laxity, normal patellar mobility, no  bony tenderness, normal meniscus and no MCL laxity. Tenderness found. Medial joint line tenderness noted.  Negative Varus/Valgus, Negative Anterior/Posterior drawer, Negative Lachman, Negative McMurray, Negative patellar apprehension or grind  Skin: He is not diaphoretic.  Vitals reviewed.       Assessment & Plan:     1. Primary osteoarthritis of left knee Suspect worsening OA. Diclofenac topical gel given as below. Advised him to wear a brace while at work. He is to call if pain continues to worsen as he may require a referral to orthopedics for consideration of hyaluronic acid injections. - diclofenac sodium (VOLTAREN) 1 % GEL; Apply 4 g topically 4 (four) times daily.  Dispense: 100 g; Refill: 3        Margaretann LovelessJennifer M Carrolyn Hilmes, PA-C  [facility]

## 2017-01-14 NOTE — Patient Instructions (Signed)
Diclofenac sodium topical solution What is this medicine? DICLOFENAC (dye KLOE fen ak) is a non-steroidal anti-inflammatory drug (NSAID). It is used to treat osteoarthritis of the knees. This medicine may be used for other purposes; ask your health care provider or pharmacist if you have questions. COMMON BRAND NAME(S): INFLAMMA-K, PENNSAID, VOPAC MDS What should I tell my health care provider before I take this medicine? They need to know if you have any of these conditions: -asthma -bleeding problems -coronary artery bypass graft (CABG) surgery within the past 2 weeks -heart disease -high blood pressure -if you frequently drink alcohol containing drinks -kidney disease -liver disease -open or infected skin -stomach problems -an unusual or allergic reaction to diclofenac, aspirin, other NSAIDs, other medicines, foods, dyes, or preservatives -pregnant or trying to get pregnant -breast-feeding How should I use this medicine? This medicine is for external use only. Follow the directions on the prescription label. Wash hands before and after use. Apply to the clean, dry skin of the knee. Rub around front, back, and sides of the knee. Do not apply to open wounds, infections, swelling, or areas of exfoliative dermatitis. Allow medicine to dry before using any other lotion or medicine on the same place. Do not get this medicine in your mouth or eyes. If this medicine gets in your eye, rinse out with plenty of cool tap water. Protect treatment area from sunlight and sun lamps. Use your doses at regular intervals. Do not use it more often than directed. A special MedGuide will be given to you by the pharmacist with each prescription and refill. Be sure to read this information carefully each time. Talk to your pediatrician regarding the use of this medicine in children. Special care may be needed. Overdosage: If you think you have taken too much of this medicine contact a poison control center or  emergency room at once. NOTE: This medicine is only for you. Do not share this medicine with others. What if I miss a dose? If you miss a dose, use it as soon as you can. If it is almost time for your next dose, use only that dose. Do not use double or extra doses. What may interact with this medicine? Do not take this medicine with any of the following medications: -cidofovir -ketorolac -methotrexate This medicine may also interact with the following medications: -aspirin -cyclosporine -lithium -medicines for blood pressure -medicines that treat or prevent blood clots like warfarin, enoxaparin, and dalteparin -NSAIDs, medicines for pain and inflammation, like ibuprofen or naproxen -other products used on the skin -steroid medicines like prednisone or cortisone This list may not describe all possible interactions. Give your health care provider a list of all the medicines, herbs, non-prescription drugs, or dietary supplements you use. Also tell them if you smoke, drink alcohol, or use illegal drugs. Some items may interact with your medicine. What should I watch for while using this medicine? Tell your doctor or healthcare professional if your symptoms do not start to get better or if they get worse. This medicine can make you more sensitive to the sun. Keep out of the sun. If you cannot avoid being in the sun, wear protective clothing and use sunscreen. Do not use sun lamps or tanning beds/booths. Do not take medicines such as ibuprofen and naproxen with this medicine. Side effects such as stomach upset, nausea, or ulcers may be more likely to occur. Many medicines available without a prescription should not be taken with this medicine. This medicine does not prevent   heart attack or stroke. In fact, this medicine may increase the chance of a heart attack or stroke. The chance may increase with longer use of this medicine and in people who have heart disease. If you take aspirin to prevent  heart attack or stroke, talk with your doctor or health care professional. This medicine can cause ulcers and bleeding in the stomach and intestines at any time during treatment. Ulcers and bleeding can happen without warning symptoms and can cause death. To reduce your risk, do not smoke cigarettes or drink alcohol while you are taking this medicine. This medicine can cause you to bleed more easily. Try to avoid damage to your teeth and gums when you brush or floss your teeth. What side effects may I notice from receiving this medicine? Side effects that you should report to your doctor or health care professional as soon as possible: -allergic reactions like skin rash, itching or hives, swelling of the face, lips, or tongue -black or bloody stools, blood in the urine or vomit -blurred vision -chest pain -difficulty breathing or wheezing -nausea or vomiting -redness, blistering, peeling or loosening of the skin, including inside the mouth -slurred speech or weakness on one side of the body -trouble passing urine or change in the amount of urine -unexplained weight gain or swelling -unusually weak or tired -yellowing of eyes or skin Side effects that usually do not require medical attention (report to your doctor or health care professional if they continue or are bothersome): -dizziness -dry skin -headache -increased sensitivity to the sun -stomach upset -tingling at the application This list may not describe all possible side effects. Call your doctor for medical advice about side effects. You may report side effects to FDA at 1-800-FDA-1088. Where should I keep my medicine? Keep out of the reach of children. Store at room temperature between 15 and 30 degrees C (59 and 86 degrees F). Keep container tightly closed. Throw away any unused medicine after the expiration date. NOTE: This sheet is a summary. It may not cover all possible information. If you have questions about this medicine,  talk to your doctor, pharmacist, or health care provider.  2018 Elsevier/Gold Standard (2015-08-17 09:11:01)  

## 2017-01-14 NOTE — Telephone Encounter (Signed)
We should be able to get this covered with a PA. Can we start one please?

## 2017-01-15 ENCOUNTER — Telehealth: Payer: Self-pay

## 2017-01-15 ENCOUNTER — Telehealth: Payer: Self-pay | Admitting: Physician Assistant

## 2017-01-15 DIAGNOSIS — M1712 Unilateral primary osteoarthritis, left knee: Secondary | ICD-10-CM

## 2017-01-15 MED ORDER — ETODOLAC 500 MG PO TABS: 500.0000 mg | ORAL_TABLET | Freq: Two times a day (BID) | ORAL | 1 refills | Status: DC

## 2017-01-15 NOTE — Telephone Encounter (Signed)
Advised patient of below.

## 2017-01-15 NOTE — Telephone Encounter (Signed)
Ok PA was denied because he is also using naproxen. It will only approve if he uses alone.  I will send in etodolac for him to take twice daily instead. He will stop naproxen and use this instead. He may use tylenol in between if needed.

## 2017-01-15 NOTE — Telephone Encounter (Signed)
Unable to leave message, mailbox is full.

## 2017-01-15 NOTE — Telephone Encounter (Signed)
[facility]

## 2017-01-15 NOTE — Telephone Encounter (Signed)
Patient's wife called and states that the insurance has denied the medication for his knee 3 times and wanted to know if you could do something different for him.  Thanks  ED

## 2017-01-15 NOTE — Telephone Encounter (Signed)
We have started a PA

## 2017-02-06 IMAGING — MR MR ABDOMEN WO/W CM MRCP
12 of 19 series · 28 of 48 positions shown · IV contrast (20mL MULTIHANCE)
Comparison: CTs including 08/15/2016.

CLINICAL DATA: Right upper quadrant pain. Elevated liver function
tests. Biliary duct dilatation on CT.

EXAM:
MRI ABDOMEN WITHOUT AND WITH CONTRAST (INCLUDING MRCP)
TECHNIQUE: Multiplanar multisequence MR imaging of the abdomen was performed
both before and after the administration of intravenous contrast.
Heavily T2-weighted images of the biliary and pancreatic ducts were
obtained, and three-dimensional MRCP images were rendered by post
processing.
CONTRAST:  20mL MULTIHANCE GADOBENATE DIMEGLUMINE 529 MG/ML IV SOLN

[Series 3: T2 · coronal · 8.0mm · 0.78mm/px · 2 of 27 slices shown (1 of 2)]
[im 1/27]
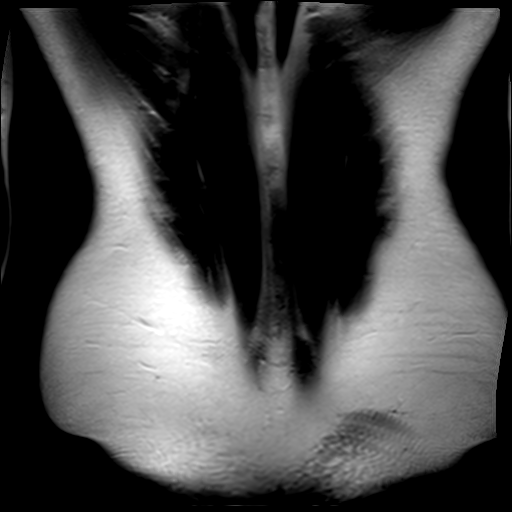
[im 27/27]
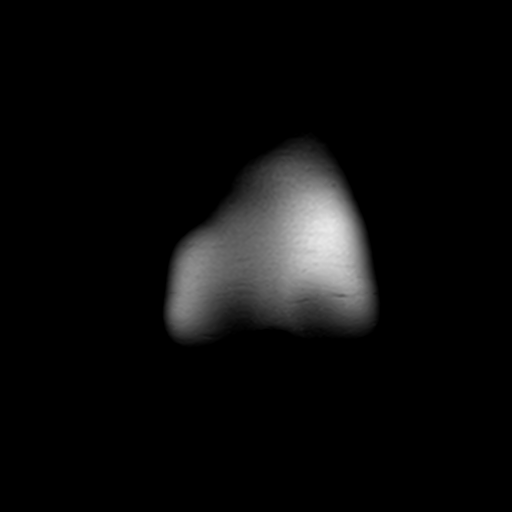

[Series 4: T2 fat-sat · axial · 7.0mm · 0.74mm/px · z∈[-107,+120]mm · 2 of 28 slices shown]
[im 1/28]
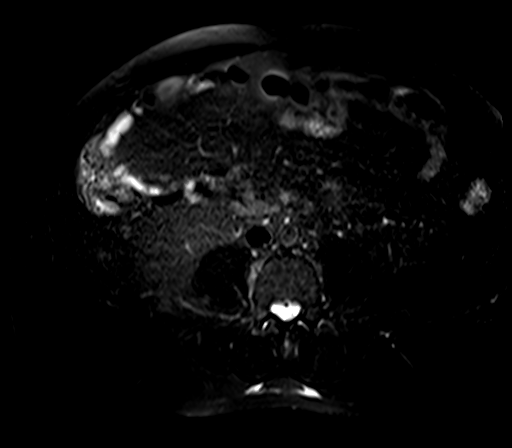
[im 28/28]
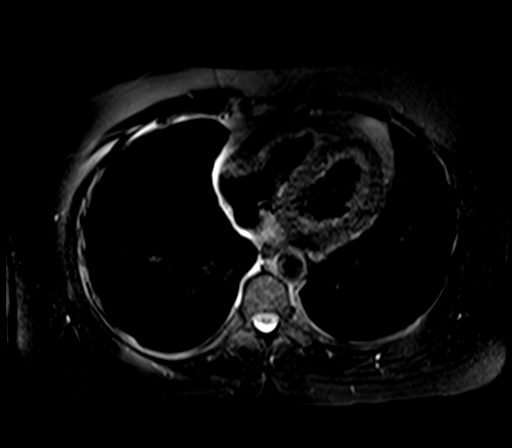

[Series 6: cor thins · coronal · 4.0mm · 0.89mm/px · 2 of 11 slices shown]
[im 1/11]
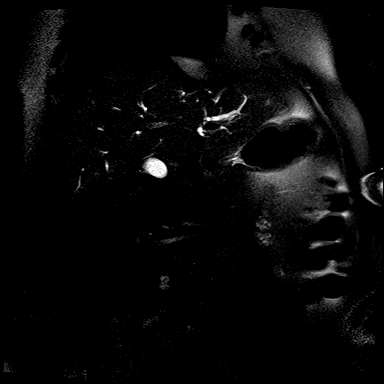
[im 11/11]
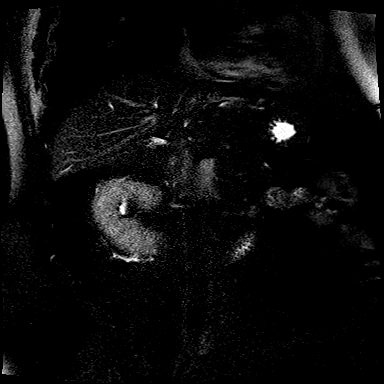

[Series 10: ax dual echo · axial · 7.0mm · 0.74mm/px · z∈[-73,+112]mm · 2 of 46 slices shown]
[im 1/46]
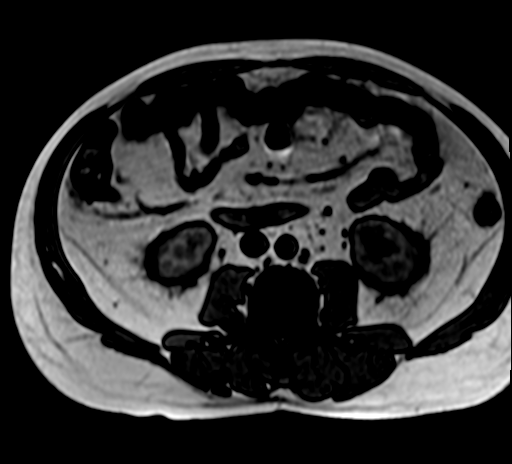
[im 46/46]
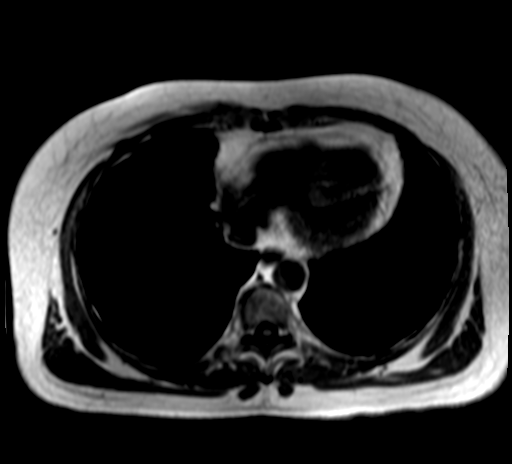

[Series 11: cor tru fisp · coronal · 4.0mm · 0.74mm/px · 2 of 40 slices shown]
[im 1/40]
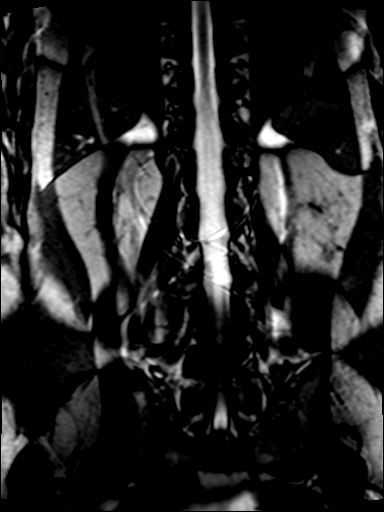
[im 40/40]
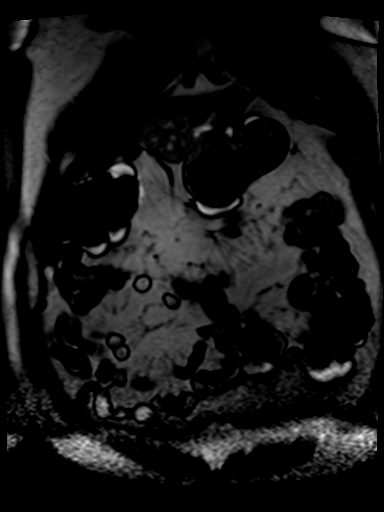

[Series 12: T2 · axial · 7.0mm · 0.74mm/px · 1 of 25 slices shown (2 of 2)]
[im 1/25]
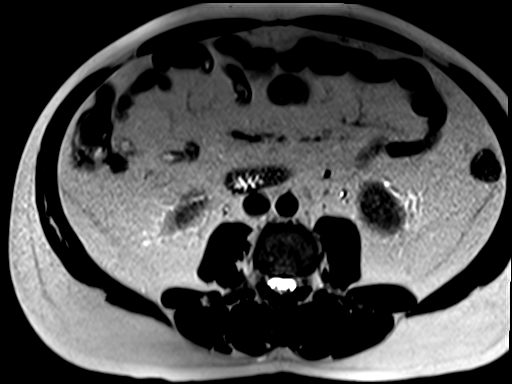

[Series 13: DWI · axial · 6.0mm · 1.98mm/px · z∈[-105,+118]mm · 4 of 96 slices shown]
[im 1/96]
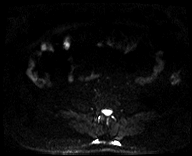
[im 32/96]
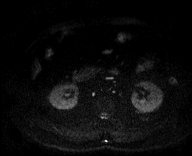
[im 64/96]
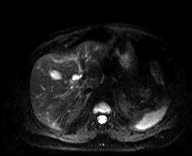
[im 96/96]
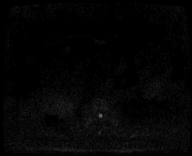

[Series 14: ax dwi_adc · axial · 6.0mm · 1.98mm/px · 1 of 32 slices shown]
[im 1/32]
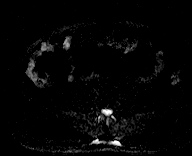

[Series 15: T1 dynamic fat-sat · axial · non-contrast · 2.5mm · 0.74mm/px · z∈[-89,+128]mm · 3 of 88 slices shown (1 of 3)]
[im 1/88]
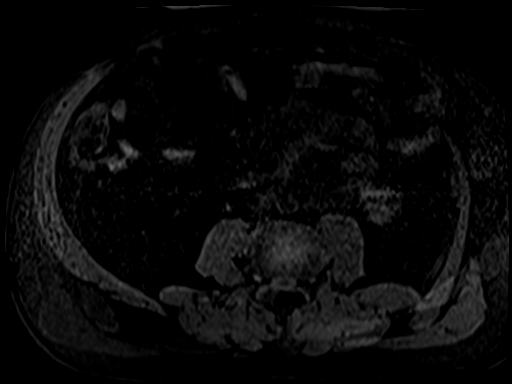
[im 44/88]
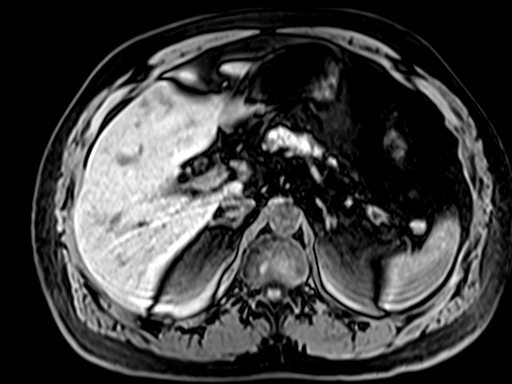
[im 88/88]
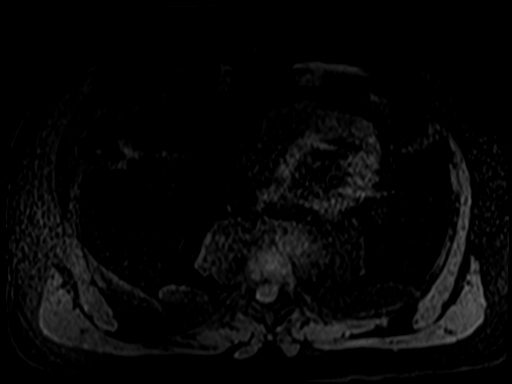

[Series 16: T1 dynamic fat-sat · axial · 2.5mm · 0.74mm/px · z∈[-89,+128]mm · 3 of 88 slices shown (2 of 3)]
[im 1/88]
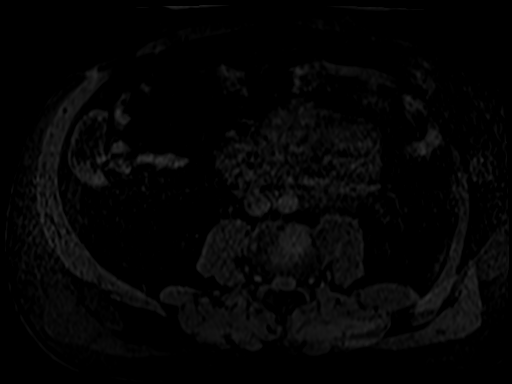
[im 44/88]
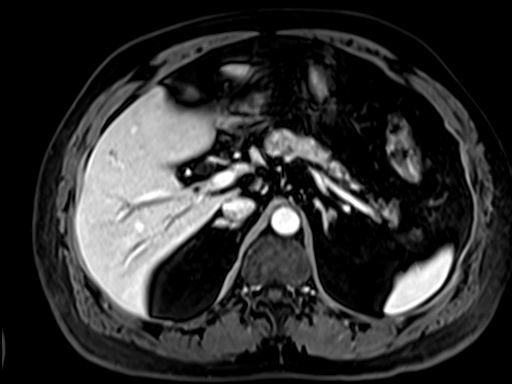
[im 88/88]
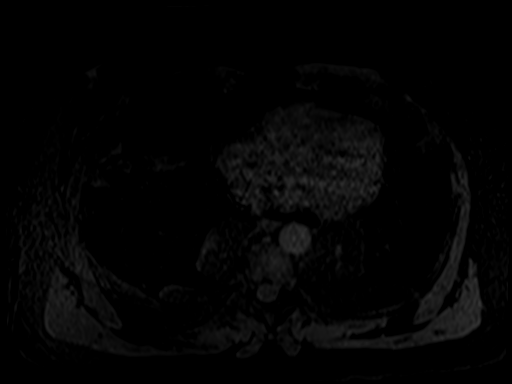

[Series 17: T1 dynamic fat-sat · axial · 2.5mm · 0.74mm/px · z∈[-89,+128]mm · 3 of 88 slices shown (3 of 3)]
[im 1/88]
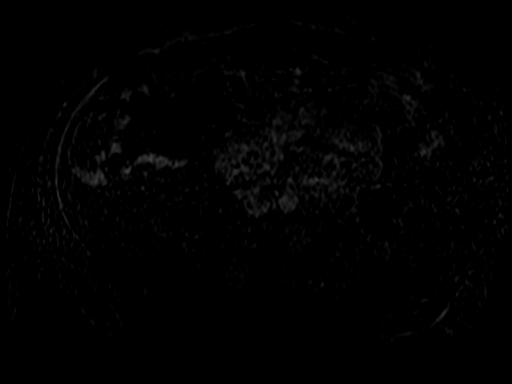
[im 44/88]
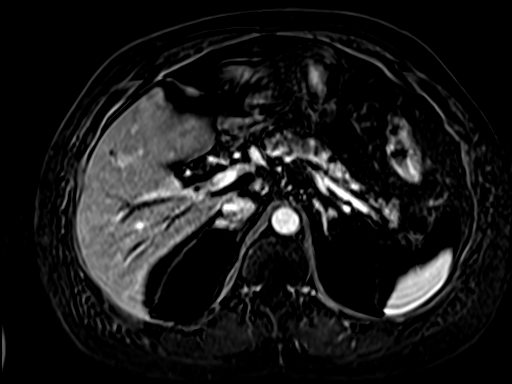
[im 88/88]
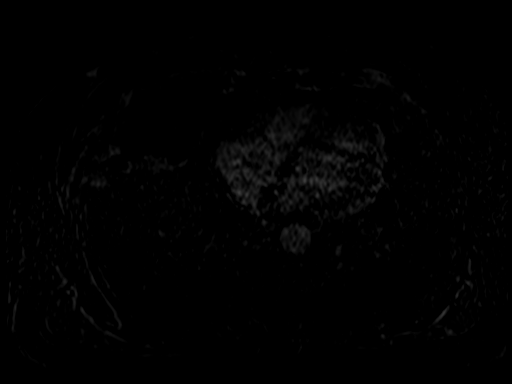

[Series 18: T1 dynamic fat-sat post-contrast · axial · 2.5mm · 0.74mm/px · z∈[-89,+128]mm · 3 of 88 slices shown]
[im 1/88]
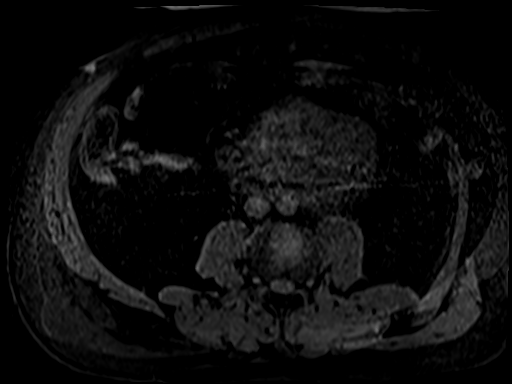
[im 44/88]
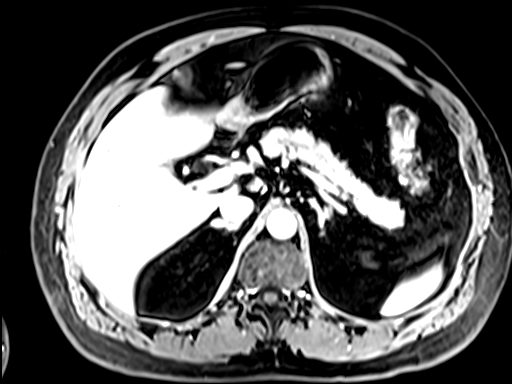
[im 88/88]
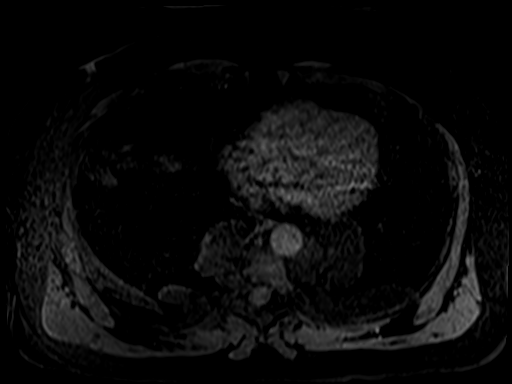

[28 of 48 positions shown; findings below may reference images not displayed]

FINDINGS: Lower chest: Normal heart size without pericardial or pleural
effusion.

Hepatobiliary: No focal liver lesion. Moderate intrahepatic biliary
duct dilatation, including a an 8 mm left intrahepatic duct on image
8/series 8. Common duct is moderately dilated for age, including at
10 mm on image 12/series 8. Subtle distal common duct stone measures
on the order of 9 mm on image 21/ series 4 and image 19/series 10.

Normal gallbladder, without evidence of cholecystitis.

Pancreas:  Normal, without mass or ductal dilatation.

Spleen:  Normal in size, without focal abnormality.

Adrenals/Urinary Tract: Normal adrenal glands. Too small to
characterize left renal lesions. Normal right kidney, without
hydronephrosis.

Stomach/Bowel: Normal stomach, without wall thickening. Normal
abdominal bowel loops.

Vascular/Lymphatic: Aortic atherosclerosis. No retroperitoneal or
retrocrural adenopathy.

Other:  No ascites.

Musculoskeletal: No acute osseous abnormality.
IMPRESSION: Moderate biliary duct dilatation with 9 mm distal common duct stone.

## 2017-02-24 ENCOUNTER — Other Ambulatory Visit: Payer: Self-pay | Admitting: Physician Assistant

## 2017-02-24 DIAGNOSIS — M1712 Unilateral primary osteoarthritis, left knee: Secondary | ICD-10-CM

## 2017-02-24 MED ORDER — ETODOLAC 500 MG PO TABS: 500.0000 mg | ORAL_TABLET | Freq: Two times a day (BID) | ORAL | 5 refills | Status: DC

## 2017-02-24 NOTE — Progress Notes (Signed)
Etodolac refilled 

## 2017-03-26 ENCOUNTER — Encounter: Payer: Self-pay | Admitting: Physician Assistant

## 2017-03-26 ENCOUNTER — Ambulatory Visit (INDEPENDENT_AMBULATORY_CARE_PROVIDER_SITE_OTHER): Payer: BLUE CROSS/BLUE SHIELD | Admitting: Physician Assistant

## 2017-03-26 VITALS — BP 130/72 | HR 64 | Temp 98.2°F | Resp 16 | Wt 211.0 lb

## 2017-03-26 DIAGNOSIS — M1712 Unilateral primary osteoarthritis, left knee: Secondary | ICD-10-CM | POA: Insufficient documentation

## 2017-03-26 DIAGNOSIS — G5603 Carpal tunnel syndrome, bilateral upper limbs: Secondary | ICD-10-CM | POA: Diagnosis not present

## 2017-03-26 MED ORDER — METHYLPREDNISOLONE 4 MG PO TBPK: ORAL_TABLET | ORAL | 0 refills | Status: DC

## 2017-03-26 NOTE — Patient Instructions (Signed)

## 2017-03-26 NOTE — Progress Notes (Signed)
Patient: Maurice SchoolsHoward Lee Male    DOB: Mar 12, 1961   56 y.o.   MRN: [id number] Visit Date: 03/26/2017  Today's Provider: Margaretann LovelessJennifer M Burnette, PA-C   Chief Complaint  Patient presents with  . Knee Pain   Subjective:    HPI Patient here today C/O recurrent left knee pain. Patient has received cortisone injection for symptoms x 2. Patient reports pain is worse with movement. He has used meloxicam in the past without relief, then used voltaren gel without relief and most recently was on etodolac without complete relief. 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     Allergies  Allergen Reactions  . 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 Anesthesia S-I-40 [Propofol] Other (See Comments)    He won't wake up   . Sulfa Antibiotics Rash     Current Outpatient Prescriptions:  .  diltiazem (DILACOR XR) 240 MG 24 hr capsule, Take 1 capsule (240 mg total) by mouth daily., Disp: 90 capsule, Rfl: 0 .  etodolac (LODINE) 500 MG tablet, Take 1 tablet (500 mg total) by mouth 2 (two) times daily., Disp: 60 tablet, Rfl: 5 .  ferrous sulfate 325 (65 FE) MG tablet, Take 325 mg by mouth daily with breakfast., Disp: , Rfl:  .  lisinopril (PRINIVIL,ZESTRIL) 40 MG tablet, take 1 tablet by mouth once daily, Disp: 90 tablet, Rfl: 3 .  NAPROXEN DR 500 MG EC tablet, , Disp: , Rfl: 0 .  ranitidine (ZANTAC) 300 MG tablet, Take 1 tablet (300 mg total) by mouth as needed., Disp: 90 tablet, Rfl: 3  Review of Systems  Constitutional: Negative.   Respiratory: Negative.   Cardiovascular: Negative.   Gastrointestinal: Negative.   Musculoskeletal: Positive for arthralgias, joint swelling and myalgias.   Left knee  Neurological: Positive for numbness (hands bilaterally in morning). Negative for weakness.    Social History  Substance Use Topics  . Smoking status: Former Smoker    Packs/day: 1.50    Years: 40.00  . Smokeless tobacco: Never Used  . Alcohol use No   Objective:   BP 130/72 (BP Location: Left Arm, Patient Position: Sitting, Cuff Size: Large)   Pulse 64   Temp 98.2 F (36.8 C) (Oral)   Resp 16   Wt 211 lb (95.7 kg)   SpO2 96%   BMI 33.05 kg/m  Vitals:   03/26/17 1117  BP: 130/72  Pulse: 64  Resp: 16  Temp: 98.2 F (36.8 C)  TempSrc: Oral  SpO2: 96%  Weight: 211 lb (95.7 kg)     Physical Exam  Constitutional: He appears well-developed and well-nourished. No distress.  HENT:  Head: Normocephalic and atraumatic.  Neck: Normal range of motion. Neck supple. No JVD present. No tracheal deviation present. No thyromegaly present.  Cardiovascular: Normal rate, regular rhythm and normal heart sounds.  Exam reveals no gallop and no friction rub.   No murmur heard. Pulmonary/Chest: Effort normal and breath sounds normal. No respiratory distress. He has no wheezes. He has no rales.  Musculoskeletal:       Right wrist: Normal.       Left wrist: Normal.       Right knee: Normal.  Left knee: He exhibits swelling. He exhibits no effusion, no erythema, normal alignment, no LCL laxity, normal patellar mobility, no bony tenderness, normal meniscus and no MCL laxity. Tenderness found. Medial joint line and lateral joint line tenderness noted.  Lymphadenopathy:    He has no cervical adenopathy.  Skin: He is not diaphoretic.  Vitals reviewed.   CLINICAL DATA:  Knee pain.  No known injury.  Initial evaluation .  EXAM: LEFT KNEE - COMPLETE 4+ VIEW  COMPARISON:  None.  FINDINGS: Mild medial compartment and patellofemoral degenerative change. No acute bony abnormality identified.  IMPRESSION: Mild medial compartment and patellofemoral degenerative change.  No acute abnormality.   Electronically Signed   By: Maisie Fus  Register   On: 08/24/2015 10:24    Assessment & Plan:     1. Primary osteoarthritis of left knee Not due for cortisone injection since last one was done on 01/03/17. He has had relief from the steroid injections they just have not lasted as long as previously. Also his symptoms have progressed as it used to only bother him during colder months and is now year round. I will give him medrol dose pak as below and refer him to Dr. Martha Clan for further evaluation and consideration of hyaluronic injections.  - methylPREDNISolone (MEDROL) 4 MG TBPK tablet; Take as directed on package instructions  Dispense: 21 tablet; Refill: 0 - Ambulatory referral to Orthopedic Surgery  2. Carpal tunnel syndrome on both sides New awakening with bilateral hand numbness over the last month. Will see if he notices any improvement with medrol dose pak.       Margaretann Loveless, PA-C  [facility]

## 2017-04-16 ENCOUNTER — Encounter: Payer: Self-pay | Admitting: Physician Assistant

## 2017-04-16 DIAGNOSIS — M1712 Unilateral primary osteoarthritis, left knee: Secondary | ICD-10-CM

## 2017-04-17 MED ORDER — ETODOLAC 500 MG PO TABS: 500.0000 mg | ORAL_TABLET | Freq: Two times a day (BID) | ORAL | 5 refills | Status: DC

## 2017-06-27 ENCOUNTER — Encounter: Payer: Self-pay | Admitting: Physician Assistant

## 2017-06-27 DIAGNOSIS — M1710 Unilateral primary osteoarthritis, unspecified knee: Secondary | ICD-10-CM

## 2017-06-27 MED ORDER — NABUMETONE 750 MG PO TABS: 750.0000 mg | ORAL_TABLET | Freq: Two times a day (BID) | ORAL | 0 refills | Status: DC | PRN

## 2017-07-01 ENCOUNTER — Encounter: Payer: Self-pay | Admitting: Physician Assistant

## 2017-07-09 ENCOUNTER — Ambulatory Visit: Payer: BLUE CROSS/BLUE SHIELD | Admitting: Physician Assistant

## 2017-07-09 ENCOUNTER — Encounter: Payer: Self-pay | Admitting: Physician Assistant

## 2017-07-09 VITALS — BP 122/68 | HR 72 | Temp 97.5°F | Resp 16 | Wt 214.0 lb

## 2017-07-09 DIAGNOSIS — R21 Rash and other nonspecific skin eruption: Secondary | ICD-10-CM | POA: Diagnosis not present

## 2017-07-09 MED ORDER — PREDNISONE 10 MG (21) PO TBPK: ORAL_TABLET | ORAL | 0 refills | Status: DC

## 2017-07-09 NOTE — Progress Notes (Signed)
Patient: Maurice SchoolsHoward Corrow Male    DOB: [birth year]   56 y.o.   MRN: [id number] Visit Date: 07/09/2017  Today's Provider: Trey SailorsAdriana M Pollak, PA-C   Chief Complaint  Patient presents with  . Rash    Started last night   Subjective:    Maurice Lee is a 56 y/o man who presents today with rash x 1 day. He says he was started on nabumetone 2 weeks ago for arthritis pain until he gets surgery.  Yesterday he noticed a red, itchy rash spreading over his entire body. No new fabrics, pets, other medications.   Rash  This is a new problem. The current episode started yesterday. The problem has been gradually worsening since onset. The rash is diffuse. The rash is characterized by redness and itchiness. He was exposed to a new medication (Pt recently started Nabumetone 750mg  ). Pertinent negatives include no cough, facial edema, shortness of breath or sore throat. Past treatments include nothing.      Allergies  Allergen Reactions  . 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 Anesthesia S-I-40 [Propofol] Other (See Comments)    He won't wake up   . Sulfa Antibiotics Rash     Current Outpatient Medications:  .  diltiazem (DILACOR XR) 240 MG 24 hr capsule, Take 1 capsule (240 mg total) by mouth daily., Disp: 90 capsule, Rfl: 0 .  ferrous sulfate 325 (65 FE) MG tablet, Take 325 mg by mouth daily with breakfast., Disp: , Rfl:  .  lisinopril (PRINIVIL,ZESTRIL) 40 MG tablet, take 1 tablet by mouth once daily, Disp: 90 tablet, Rfl: 3 .  nabumetone (RELAFEN) 750 MG tablet, Take 1 tablet (750 mg total) by mouth 2 (two) times daily as needed., Disp: 60 tablet, Rfl: 0 .  predniSONE (STERAPRED UNI-PAK 21 TAB) 10 MG (21) TBPK tablet, Take 6 pills on day 1, take 5 pills on day 2, and so on  until complete, Disp: 21 tablet, Rfl: 0 .  ranitidine (ZANTAC) 300 MG tablet, Take 1 tablet (300 mg total) by mouth as needed., Disp: 90 tablet, Rfl: 3  Review of Systems  Constitutional: Negative.   HENT: Negative for sore throat.   Respiratory: Negative for cough and shortness of breath.   Skin: Positive for rash. Negative for color change, pallor and wound.    Social History   Tobacco Use  . Smoking status: Former Smoker    Packs/day: 1.50    Years: 40.00    Pack years: 60.00  . Smokeless tobacco: Never Used  Substance Use Topics  . Alcohol use: No   Objective:   BP 122/68 (BP Location: Left Arm, Patient Position: Sitting, Cuff Size: Large)   Pulse 72   Temp (!) 97.5 F (36.4 C) (Oral)   Resp 16   Wt 214 lb (97.1 kg)   BMI 33.52 kg/m  Vitals:   07/09/17 1059  BP: 122/68  Pulse: 72  Resp: 16  Temp: (!) 97.5 F (36.4 C)  TempSrc: Oral  Weight: 214 lb (97.1 kg)     Physical Exam  Constitutional: He is oriented to person, place, and time. He appears well-developed and well-nourished.  Cardiovascular: Normal rate and regular rhythm.  Pulmonary/Chest: Effort normal and breath sounds normal. No respiratory distress.  Neurological: He is  alert and oriented to person, place, and time.  Skin: Skin is warm and dry. Rash noted. There is erythema.  Erythematous maculopapular rash over entirety of body.  Psychiatric: He has a normal mood and affect. His behavior is normal.        Assessment & Plan:     1. Skin rash  Advised to discontinue new medication. Do not take NSAIDs while taking prednisone. Tylenol okay.  - predniSONE (STERAPRED UNI-PAK 21 TAB) 10 MG (21) TBPK tablet; Take 6 pills on day 1, take 5 pills on day 2, and so on until complete  Dispense: 21 tablet; Refill: 0       Trey SailorsAdriana M Pollak, PA-C  [facility]

## 2017-07-09 NOTE — Patient Instructions (Signed)
Drug Allergy A drug allergy is when the body's disease-fighting system (immune system) reacts badly to a medicine. Drug allergies range from mild to severe, and they can be life-threatening in some cases. Some allergic reactions occur one week or more after you are exposed to a medicine (delayed reaction). A sudden (acute), severe allergic reaction that affects multiple areas of the body is called an anaphylactic reaction (anaphylaxis). Anaphylaxis can be life-threatening. All allergic reactions to a medicine require medical evaluation, even if an allergic reaction appears to be mild (minor). What are the causes? Drug allergies happen when the immune system wrongly identifies a medicine as being harmful. When this happens, the body releases proteins (antibodies) and other compounds, such as histamine, into the bloodstream. This causes swelling in certain tissues and loss of blood pressure to important areas, such as the heart and lungs. Almost any medicine can cause an allergic reaction. Medicines that commonly cause allergic reactions (are common allergens) include:  Penicillin.  Sulfa medicines (sulfonamides).  Medicines that numb certain areas of the body (local anesthetics).  X-ray dyes that contain iodine.  What are the signs or symptoms? Mild Allergic Reaction  Nasal congestion.  Tingling in the mouth.  An itchy, red rash. Severe Allergic Reaction  Swelling of the eyes, lips, face, or tongue.  Swelling of the back of the mouth and the throat.  Wheezing.  A hoarse voice.  Itchy, red, swollen areas of skin (hives).  Dizziness or light-headedness.  Fainting.  Anxiety or confusion.  Abdominal pain.  Difficulty breathing, speaking, or swallowing.  Chest tightness.  Fast or irregular heartbeats (palpitations).  Vomiting.  Diarrhea. How is this diagnosed? This condition is diagnosed from a physical exam and your history of recent exposure to one or more medicines.  You may be referred for follow-up testing by a health care provider who specializes in allergies. This testing can confirm the diagnosis of a drug allergy and determine which medicines you are allergic to. Testing may include:  Skin tests. These may involve: ? Injecting a small amount of the possible allergen between layers of your skin (intradermal injection). ? Applying patches to your skin.  Blood tests.  Drug challenge. For this test, a health care provider gives you a small amount of a medicine in gradual doses while watching for an allergic reaction.  If you are unsure of what caused your allergic reaction, your health care provider may ask you for:  Information about all medicines that you take on a regular basis, including: ? The name of each medicine. ? How much (dosage) and how many times you take each medicine per day. ? The form of each medicine, such as pill, liquid, or injection.  The date and time of your reaction.  How is this treated? There is no cure for allergies. However, an allergic reaction can be treated with:  Medicines that help: ? To reduce pain and swelling (NSAIDs). ? To relieve itching and hives (antihistamines). ? To reduce swelling (corticosteroids).  Respiratory inhalers. These are inhaled medicines that help to widen (dilate) the airways in your lungs.  Injections of medicine that helps to relax the muscles in your airways and tighten your blood vessels (epinephrine).  Severe allergic reactions, such as anaphylaxis, require immediate treatment in a hospital. If you have an anaphylactic reaction, you may need to be hospitalized for observation. You may be prescribed rescue medicines, such as epinephrine. Epinephrine comes in many forms, including what is commonly called an auto-injector "pen" (pre-filled automatic  epinephrine injection device). Follow these instructions at home: If You Have a Severe Allergy  Always keep an auto-injector pen or your  anaphylaxis kit near you. These can be lifesaving if you have a severe reaction. Use your auto-injector pen or anaphylaxis kit as told by your health care provider.  Make sure that you, the members of your household, and your employer know: ? How to use an anaphylaxis kit. ? How to use an auto-injector pen to give you an epinephrine injection.  Replace your epinephrine immediately after you use your auto-injector pen, in case you have another reaction.  Wear a medical alert bracelet or necklace that states your drug allergy, if told by your health care provider. General instructions  Avoidmedicines that you are allergic to.  Take over-the-counter and prescription medicines only as told by your health care provider.  If you were given medicines to treat your reaction, do not drive until your health care provider approves.  If you have hives or a rash: ? Use an over-the-counter antihistamineas told by your health care provider. ? Apply cold, wet cloths (cold compresses) to your skin or take baths or showers in cool water. Avoid hot water.  If you had tests done, it is your responsibility to get your test results. Ask your health care provider when your results will be ready.  Tell any health care providers who care for you that you have a drug allergy.  Keep all follow-up visits as told by your health care provider. This is important. Contact a health care provider if:  You think that you are having an allergic reaction. Symptoms of an allergic reaction usually start within 30 minutes after you are exposed to a medicine.  You have symptoms that last more than 2 days after your reaction.  Your symptoms get worse.  You develop new symptoms. Get help right away if:  You needed to use epinephrine. ? An epinephrine injection helps to manage life-threatening allergic reactions, but you still need to go to the emergency room even if epinephrine seems to work. This is important because  anaphylaxis may happen again within 72 hours (rebound anaphylaxis). ? If you used epinephrine to treat anaphylaxis outside of the hospital, you need additional medical care. This may include more doses of epinephrine.  You develop symptoms of a severe allergic reaction. These symptoms may represent a serious problem that is an emergency. Do not wait to see if the symptoms will go away. Use your auto-injector pen or anaphylaxis kit as you have been instructed, and get medical help right away. Call your local emergency services (911 in the [place] Do not drive yourself to the hospital. This information is not intended to replace advice given to you by your health care provider. Make sure you discuss any questions you have with your health care provider. Document Released: 07/15/2005 Document Revised: 11/24/2015 Document Reviewed: 02/14/2015 Elsevier Interactive Patient Education  [employer].

## 2017-08-07 ENCOUNTER — Encounter: Payer: Self-pay | Admitting: Physician Assistant

## 2017-08-07 ENCOUNTER — Ambulatory Visit: Payer: BLUE CROSS/BLUE SHIELD | Admitting: Physician Assistant

## 2017-08-07 VITALS — BP 130/70 | HR 72 | Temp 98.1°F | Resp 16 | Ht 67.0 in | Wt 212.2 lb

## 2017-08-07 DIAGNOSIS — R748 Abnormal levels of other serum enzymes: Secondary | ICD-10-CM | POA: Diagnosis not present

## 2017-08-07 DIAGNOSIS — Z01818 Encounter for other preprocedural examination: Secondary | ICD-10-CM | POA: Diagnosis not present

## 2017-08-07 DIAGNOSIS — I10 Essential (primary) hypertension: Secondary | ICD-10-CM | POA: Diagnosis not present

## 2017-08-07 NOTE — Progress Notes (Signed)
Patient: Maurice Lee Male    DOB: 15-Mar-1961   57 y.o.   MRN: [id number] Visit Date: 08/07/2017  Today's Provider: Margaretann Loveless, PA-C   Chief Complaint  Patient presents with  . Pre-op Exam   Subjective:    HPI Patient is coming in today for preoperative risk evaluation clearance. Patient is scheduled for left knee partial replacement at [facility] on 09/12/17 by Dr. Cassell Smiles. Patient denies any chest pain, SOB, cough, lower extremity edema.     Allergies  Allergen Reactions  . 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 Anesthesia S-I-40 [Propofol] Other (See Comments)    He won't wake up   . Sulfa Antibiotics Rash     Current Outpatient Medications:  .  diltiazem (DILACOR XR) 240 MG 24 hr capsule, Take 1 capsule (240 mg total) by mouth daily., Disp: 90 capsule, Rfl: 0 .  ferrous sulfate 325 (65 FE) MG tablet, Take 325 mg by mouth daily with breakfast., Disp: , Rfl:  .  lisinopril (PRINIVIL,ZESTRIL) 40 MG tablet, take 1 tablet by mouth once daily, Disp: 90 tablet, Rfl: 3 .  nabumetone (RELAFEN) 750 MG tablet, Take 1 tablet (750 mg total) by mouth 2 (two) times daily as needed. (Patient not taking: Reported on 08/07/2017), Disp: 60 tablet, Rfl: 0 .  predniSONE (STERAPRED UNI-PAK 21 TAB) 10 MG (21) TBPK tablet, Take 6 pills on day 1, take 5 pills on day 2, and so on until complete (Patient not taking: Reported on 08/07/2017), Disp: 21 tablet, Rfl: 0 .  ranitidine (ZANTAC) 300 MG tablet, Take 1 tablet (300 mg total) by mouth as needed., Disp: 90 tablet, Rfl: 3  Review of Systems  Constitutional: Negative.   Respiratory: Negative.   Cardiovascular: Negative.   Gastrointestinal: Negative.   Musculoskeletal: Positive for arthralgias.  Neurological: Negative.     Psychiatric/Behavioral: Negative.     Social History   Tobacco Use  . Smoking status: Former Smoker    Packs/day: 1.50    Years: 40.00    Pack years: 60.00  . Smokeless tobacco: Never Used  Substance Use Topics  . Alcohol use: No   Objective:   BP 130/70 (BP Location: Left Arm, Patient Position: Sitting, Cuff Size: Normal)   Pulse 72   Temp 98.1 F (36.7 C) (Oral)   Resp 16   Ht 5\' 7"  (1.702 m)   Wt 212 lb 3.2 oz (96.3 kg)   SpO2 97%   BMI 33.24 kg/m     Physical Exam  Constitutional: He appears well-developed and well-nourished. No distress.  HENT:  Head: Normocephalic and atraumatic.  Neck: Normal range of motion. Neck supple. No JVD present. No tracheal deviation present. No thyromegaly present.  Cardiovascular: Normal rate, regular rhythm and normal heart sounds. Exam reveals no gallop and no friction rub.  No murmur heard. Pulmonary/Chest: Effort normal and breath sounds normal. No respiratory distress. He has no wheezes. He has no rales.  Musculoskeletal: He exhibits no edema.  Lymphadenopathy:    He has no cervical adenopathy.  Skin: He is not diaphoretic.  Psychiatric: He has a normal mood and affect. His behavior is normal. Judgment and thought content normal.  Vitals reviewed.     Assessment & Plan:     1. Preop examination  Chales AbrahamsGupta cardiovascular risk score is low at 0.13% and Lee Criteria is very low risk at 0.4%. EKG today showed NSR rate of 60. Patient does have occasional bradycardia seen previously on EKGs, but no ST segment changes personally reviewed by me. Labs will be checked as below. If labs are stable and normal patient will be considered low risk for surgical procedure. He is still awaiting cardiology clearance by Dr. Darrold JunkerParaschos. If cleared by cardiology he is medically cleared to proceed.  - EKG 12-Lead - CBC w/Diff/Platelet - Comprehensive Metabolic Panel (CMET) - PT and PTT  2. Essential (primary) hypertension Stable on lisnopril 40mg .   - CBC w/Diff/Platelet - Comprehensive Metabolic Panel (CMET) - PT and PTT  3. Elevated liver enzymes H/O this. - CBC w/Diff/Platelet - Comprehensive Metabolic Panel (CMET) - PT and PTT       Margaretann LovelessJennifer M Laticia Vannostrand, PA-C  [facility]

## 2017-08-08 ENCOUNTER — Encounter: Payer: Self-pay | Admitting: Physician Assistant

## 2017-08-09 LAB — CBC WITH DIFFERENTIAL/PLATELET
BASOS: 1 %
Basophils Absolute: 0.1 10*3/uL (ref 0.0–0.2)
EOS (ABSOLUTE): 0.3 10*3/uL (ref 0.0–0.4)
EOS: 5 %
HEMATOCRIT: 43.8 % (ref 37.5–51.0)
Hemoglobin: 14.6 g/dL (ref 13.0–17.7)
IMMATURE GRANS (ABS): 0 10*3/uL (ref 0.0–0.1)
Immature Granulocytes: 0 %
LYMPHS ABS: 1.6 10*3/uL (ref 0.7–3.1)
LYMPHS: 29 %
MCH: 30.4 pg (ref 26.6–33.0)
MCHC: 33.3 g/dL (ref 31.5–35.7)
MCV: 91 fL (ref 79–97)
MONOCYTES: 11 %
Monocytes Absolute: 0.6 10*3/uL (ref 0.1–0.9)
NEUTROS ABS: 2.9 10*3/uL (ref 1.4–7.0)
Neutrophils: 54 %
Platelets: 351 10*3/uL (ref 150–379)
RBC: 4.81 x10E6/uL (ref 4.14–5.80)
RDW: 13.1 % (ref 12.3–15.4)
WBC: 5.3 10*3/uL (ref 3.4–10.8)

## 2017-08-09 LAB — COMPREHENSIVE METABOLIC PANEL
A/G RATIO: 1.6 (ref 1.2–2.2)
ALBUMIN: 4.1 g/dL (ref 3.5–5.5)
ALK PHOS: 74 IU/L (ref 39–117)
ALT: 12 IU/L (ref 0–44)
AST: 14 IU/L (ref 0–40)
BILIRUBIN TOTAL: 0.3 mg/dL (ref 0.0–1.2)
BUN / CREAT RATIO: 13 (ref 9–20)
BUN: 15 mg/dL (ref 6–24)
CHLORIDE: 103 mmol/L (ref 96–106)
CO2: 25 mmol/L (ref 20–29)
Calcium: 9.2 mg/dL (ref 8.7–10.2)
Creatinine, Ser: 1.15 mg/dL (ref 0.76–1.27)
GFR calc non Af Amer: 72 mL/min/{1.73_m2} (ref >59)
GFR, EST AFRICAN AMERICAN: 83 mL/min/{1.73_m2} (ref >59)
GLOBULIN, TOTAL: 2.5 g/dL (ref 1.5–4.5)
GLUCOSE: 104 mg/dL — AB (ref 65–99)
Potassium: 4.4 mmol/L (ref 3.5–5.2)
SODIUM: 141 mmol/L (ref 134–144)
TOTAL PROTEIN: 6.6 g/dL (ref 6.0–8.5)

## 2017-08-09 LAB — PT AND PTT
INR: 1 (ref 0.8–1.2)
Prothrombin Time: 10.4 s (ref 9.1–12.0)
aPTT: 29 s (ref 24–33)

## 2017-08-12 ENCOUNTER — Telehealth: Payer: Self-pay

## 2017-08-12 NOTE — Telephone Encounter (Signed)
Patient advised as directed below.  Thanks,  -Joseline 

## 2017-08-12 NOTE — Telephone Encounter (Signed)
-----   Message from Margaretann LovelessJennifer M Burnette, PA-C sent at 08/12/2017 11:22 AM EST ----- Labs are normal. Will print to send with preop paperwork

## 2017-09-01 ENCOUNTER — Telehealth: Payer: Self-pay

## 2017-09-01 NOTE — Telephone Encounter (Signed)
Patient called to request a appointment for a right knee cortisone injection. Appointment is scheduled for 09/02/17 at 8:45 am. Patient states he is having left knee surgery on Friday. He wanted to make sure Antony ContrasJenni was able to give the injection with him having surgery this week. Please advise. CB# [phone].

## 2017-09-01 NOTE — Telephone Encounter (Signed)
Patient advised as below. Patient verbalizes understanding and is in agreement with treatment plan.  

## 2017-09-01 NOTE — Telephone Encounter (Signed)
No I will not give this unless he calls Dr. Martha ClanKrasinski to confirm he can have same week of surgery. They may not want him to have it this soon to surgery as it weakens ligaments and tendons.

## 2017-09-02 ENCOUNTER — Ambulatory Visit: Payer: BLUE CROSS/BLUE SHIELD | Admitting: Physician Assistant

## 2017-09-02 ENCOUNTER — Encounter: Payer: Self-pay | Admitting: Physician Assistant

## 2017-09-02 VITALS — BP 150/70 | HR 69 | Temp 97.8°F | Resp 16 | Wt 214.4 lb

## 2017-09-02 DIAGNOSIS — M1711 Unilateral primary osteoarthritis, right knee: Secondary | ICD-10-CM | POA: Diagnosis not present

## 2017-09-02 MED ORDER — METHYLPREDNISOLONE ACETATE 80 MG/ML IJ SUSP
80.0000 mg | Freq: Once | INTRAMUSCULAR | Status: AC
  Administered 2017-09-02: 80 mg via INTRA_ARTICULAR

## 2017-09-02 NOTE — Patient Instructions (Signed)
Knee Injection, Care After  Refer to this sheet in the next few weeks. These instructions provide you with information about caring for yourself after your procedure. Your health care provider may also give you more specific instructions. Your treatment has been planned according to current medical practices, but problems sometimes occur. Call your health care provider if you have any problems or questions after your procedure.  What can I expect after the procedure?  After the procedure, it is common to have:   Soreness.   Warmth.   Swelling.    You may have more pain, swelling, and warmth than you did before the injection. This reaction may last for about one day.  Follow these instructions at home:  Bathing   If you were given a bandage (dressing), keep it dry until your health care provider says it can be removed. Ask your health care provider when you can start showering or taking a bath.  Managing pain, stiffness, and swelling   If directed, apply ice to the injection area:  ? Put ice in a plastic bag.  ? Place a towel between your skin and the bag.  ? Leave the ice on for 20 minutes, 2-3 times per day.   Do not apply heat to your knee.   Raise the injection area above the level of your heart while you are sitting or lying down.  Activity   Avoid strenuous activities for as long as directed by your health care provider. Ask your health care provider when you can return to your normal activities.  General instructions   Take medicines only as directed by your health care provider.   Do not take aspirin or other over-the-counter medicines unless your health care provider says you can.   Check your injection site every day for signs of infection. Watch for:  ? Redness, swelling, or pain.  ? Fluid, blood, or pus.   Follow your health care provider's instructions about dressing changes and removal.  Contact a health care provider if:   You have symptoms at your injection site that last longer than  two days after your procedure.   You have redness, swelling, or pain in your injection area.   You have fluid, blood, or pus coming from your injection site.   You have warmth in your injection area.   You have a fever.   Your pain is not controlled with medicine.  Get help right away if:   Your knee turns very red.   Your knee becomes very swollen.   Your knee pain is severe.  This information is not intended to replace advice given to you by your health care provider. Make sure you discuss any questions you have with your health care provider.  Document Released: 08/05/2014 Document Revised: 03/20/2016 Document Reviewed: 05/25/2014  Elsevier Interactive Patient Education  2018 Elsevier Inc.

## 2017-09-02 NOTE — Progress Notes (Signed)
Patient: Maurice Lee Male    DOB: 11-04-1960   57 y.o.   MRN: [id number] Visit Date: 09/02/2017  Today's Provider: Margaretann Loveless, PA-C   Chief Complaint  Patient presents with  . Knee Pain   Subjective:    HPI Patient coming in today for right knee pain. He is requesting cortisone injection. Patient is having left knee surgery this Friday with Dr. Meridee Score, [facility]. He has been having worsening right knee pain with the colder weather.      Allergies  Allergen Reactions  . 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 Sulfa Antibiotics Rash     Current Outpatient Medications:  .  diltiazem (DILACOR XR) 240 MG 24 hr capsule, Take 1 capsule (240 mg total) by mouth daily., Disp: 90 capsule, Rfl: 0 .  ferrous sulfate 325 (65 FE) MG tablet, Take 325 mg by mouth daily with breakfast., Disp: , Rfl:  .  lisinopril (PRINIVIL,ZESTRIL) 40 MG tablet, take 1 tablet by mouth once daily, Disp: 90 tablet, Rfl: 3 .  ranitidine (ZANTAC) 300 MG tablet, Take 1 tablet (300 mg total) by mouth as needed., Disp: 90 tablet, Rfl: 3  Review of Systems  Constitutional: Negative.   Respiratory: Negative.   Cardiovascular: Negative.   Gastrointestinal: Negative.   Musculoskeletal: Positive for arthralgias.  Neurological: Negative.     Social History   Tobacco Use  . Smoking status: Former Smoker    Packs/day: 1.50    Years: 40.00    Pack years: 60.00  . Smokeless tobacco: Never Used  Substance Use Topics  . Alcohol use: No   Objective:   BP (!) 150/70 (BP Location: Left Arm, Patient Position: Sitting, Cuff Size: Normal)   Pulse 69   Temp 97.8 F (36.6 C) (Oral)   Resp 16   Wt 214 lb 6.4 oz (97.3 kg)   BMI 33.58 kg/m     Physical Exam  Constitutional: He appears  well-developed and well-nourished. No distress.  HENT:  Head: Normocephalic and atraumatic.  Neck: Normal range of motion. Neck supple.  Cardiovascular: Normal rate, regular rhythm and normal heart sounds. Exam reveals no gallop and no friction rub.  No murmur heard. Pulmonary/Chest: Effort normal and breath sounds normal. No respiratory distress. He has no wheezes. He has no rales.  Musculoskeletal:       Right knee: He exhibits decreased range of motion. He exhibits no swelling and no effusion. Tenderness found. Medial joint line tenderness noted.       Left knee: He exhibits decreased range of motion. He exhibits no swelling and no effusion. Tenderness found. Medial joint line and lateral joint line tenderness noted.  Skin: He is not diaphoretic.  Vitals reviewed.       Assessment & Plan:     1. Primary osteoarthritis of right knee Right knee injection given today for right knee OA. See procedure note below.  - methylPREDNISolone acetate (DEPO-MEDROL) injection 80 mg  Procedure Note: Benefits, risks (including infection, tattooing, adipose dimpling, and tendon rupture) and alternatives were explained to the patient. All questions were sought and answered.  Patient agreed to continue and verbal consent was obtained.   An aspiration and steroid injection was performed on right knee using 4cc of 1% plain Xyloocaine and 80  mg of depo-medrol. There was minimal bleeding. Hemostasis was intact. A dry dressing was applied. The procedure was well tolerated.       Margaretann LovelessJennifer M Burnette, PA-C  [facility]

## 2017-11-10 ENCOUNTER — Ambulatory Visit: Payer: BLUE CROSS/BLUE SHIELD | Admitting: Physician Assistant

## 2017-11-10 ENCOUNTER — Encounter: Payer: Self-pay | Admitting: Physician Assistant

## 2017-11-10 VITALS — BP 140/80 | HR 79 | Temp 97.7°F | Resp 16 | Wt 211.6 lb

## 2017-11-10 DIAGNOSIS — M1711 Unilateral primary osteoarthritis, right knee: Secondary | ICD-10-CM | POA: Diagnosis not present

## 2017-11-10 MED ORDER — METHYLPREDNISOLONE ACETATE 80 MG/ML IJ SUSP
80.0000 mg | Freq: Once | INTRAMUSCULAR | Status: AC
  Administered 2017-11-10: 80 mg via INTRA_ARTICULAR

## 2017-11-10 NOTE — Progress Notes (Signed)
       Patient: Maurice Lee Male    DOB: 12/09/1960   57 y.o.   MRN: [id number] Visit Date: 11/10/2017  Today's Provider: Margaretann LovelessJennifer M Burnette, PA-C   Chief Complaint  Patient presents with  . Knee Pain   Subjective:    HPI Patient here today with c/o right knee pain for the last couple of weeks. Patient reports that his knee is hurting. No swelling. No injury. He is requesting a cortisone shot. Patient had a partial knee replacement on his left knee.    Allergies  Allergen Reactions  . 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 Nabumetone Rash  . Sulfa Antibiotics Rash     Current Outpatient Medications:  .  diltiazem (DILACOR XR) 240 MG 24 hr capsule, Take 1 capsule (240 mg total) by mouth daily., Disp: 90 capsule, Rfl: 0 .  HYDROcodone-acetaminophen (NORCO/VICODIN) 5-325 MG tablet, hydrocodone 5 mg-acetaminophen 325 mg tablet  Take 1 tablet every 6 hours by oral route as needed., Disp: , Rfl:  .  lisinopril (PRINIVIL,ZESTRIL) 40 MG tablet, take 1 tablet by mouth once daily, Disp: 90 tablet, Rfl: 3 .  ranitidine (ZANTAC) 300 MG tablet, Take 1 tablet (300 mg total) by mouth as needed., Disp: 90 tablet, Rfl: 3  Review of Systems  Constitutional: Negative.   Respiratory: Negative.   Cardiovascular: Negative.   Gastrointestinal: Negative.   Musculoskeletal: Positive for arthralgias. Negative for joint swelling.    Social History   Tobacco Use  . Smoking status: Former Smoker    Packs/day: 1.50    Years: 40.00    Pack years: 60.00  . Smokeless tobacco: Never Used  Substance Use Topics  . Alcohol use: No   Objective:   BP 140/80 (BP Location: Left Arm, Patient Position: Sitting, Cuff Size: Large)   Pulse 79   Temp 97.7 F (36.5 C) (Oral)   Resp 16   Wt 211  lb 9.6 oz (96 kg)   BMI 33.14 kg/m    Physical Exam  Constitutional: He appears well-developed and well-nourished.  Musculoskeletal:       Right knee: He exhibits normal range of motion, no swelling, no effusion, normal alignment, no LCL laxity, normal patellar mobility, no bony tenderness, normal meniscus and no MCL laxity. Tenderness found. Medial joint line tenderness noted.  Vitals reviewed.      Assessment & Plan:     1. Primary osteoarthritis of right knee Steroid injection given as below, see procedure note. Tolerated well.  - methylPREDNISolone acetate (DEPO-MEDROL) injection 80 mg   Procedure Note: Benefits, risks (including infection, tattooing, adipose dimpling, and tendon rupture) and alternatives were explained to the patient. All questions were sought and answered.  Patient agreed to continue and verbal consent was obtained.   An aspiration and steroid injection was performed on right knee using 4cc of 1% plain Xyloocaine and 80 mg of depo-medrol. There was minimal bleeding. Hemostasis was intact. A dry dressing was applied. The procedure was well tolerated.       Margaretann LovelessJennifer M Burnette, PA-C  [facility]

## 2017-11-10 NOTE — Patient Instructions (Signed)
Knee Injection, Care After  Refer to this sheet in the next few weeks. These instructions provide you with information about caring for yourself after your procedure. Your health care provider may also give you more specific instructions. Your treatment has been planned according to current medical practices, but problems sometimes occur. Call your health care provider if you have any problems or questions after your procedure.  What can I expect after the procedure?  After the procedure, it is common to have:   Soreness.   Warmth.   Swelling.    You may have more pain, swelling, and warmth than you did before the injection. This reaction may last for about one day.  Follow these instructions at home:  Bathing   If you were given a bandage (dressing), keep it dry until your health care provider says it can be removed. Ask your health care provider when you can start showering or taking a bath.  Managing pain, stiffness, and swelling   If directed, apply ice to the injection area:  ? Put ice in a plastic bag.  ? Place a towel between your skin and the bag.  ? Leave the ice on for 20 minutes, 2-3 times per day.   Do not apply heat to your knee.   Raise the injection area above the level of your heart while you are sitting or lying down.  Activity   Avoid strenuous activities for as long as directed by your health care provider. Ask your health care provider when you can return to your normal activities.  General instructions   Take medicines only as directed by your health care provider.   Do not take aspirin or other over-the-counter medicines unless your health care provider says you can.   Check your injection site every day for signs of infection. Watch for:  ? Redness, swelling, or pain.  ? Fluid, blood, or pus.   Follow your health care provider's instructions about dressing changes and removal.  Contact a health care provider if:   You have symptoms at your injection site that last longer than  two days after your procedure.   You have redness, swelling, or pain in your injection area.   You have fluid, blood, or pus coming from your injection site.   You have warmth in your injection area.   You have a fever.   Your pain is not controlled with medicine.  Get help right away if:   Your knee turns very red.   Your knee becomes very swollen.   Your knee pain is severe.  This information is not intended to replace advice given to you by your health care provider. Make sure you discuss any questions you have with your health care provider.  Document Released: 08/05/2014 Document Revised: 03/20/2016 Document Reviewed: 05/25/2014  Elsevier Interactive Patient Education  2018 Elsevier Inc.

## 2017-12-05 ENCOUNTER — Ambulatory Visit: Payer: BLUE CROSS/BLUE SHIELD | Admitting: Physician Assistant

## 2017-12-05 ENCOUNTER — Encounter: Payer: Self-pay | Admitting: Physician Assistant

## 2017-12-05 VITALS — BP 138/66 | HR 64 | Temp 98.2°F | Resp 16 | Wt 213.0 lb

## 2017-12-05 DIAGNOSIS — R21 Rash and other nonspecific skin eruption: Secondary | ICD-10-CM

## 2017-12-05 MED ORDER — AMOXICILLIN-POT CLAVULANATE 875-125 MG PO TABS: 1.0000 | ORAL_TABLET | Freq: Two times a day (BID) | ORAL | 0 refills | Status: AC

## 2017-12-05 NOTE — Progress Notes (Signed)
Patient: Maurice Lee Male    DOB: [birth year]   57 y.o.   MRN: [id number] Visit Date: 12/05/2017  Today's Provider: Trey Sailors, PA-C   Chief Complaint  Patient presents with  . Insect Bite   Subjective:    HPI   Abscess: Patient reports rash x 3 days in left axillary area. He thinks he may have been bitten by something, there are 3-4 red dots that have mildly increased in size. No pus, weeping, itching, pain, fevers, nausea, vomiting.        Allergies  Allergen Reactions  . 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 Nabumetone Rash  . Sulfa Antibiotics Rash     Current Outpatient Medications:  .  diltiazem (DILACOR XR) 240 MG 24 hr capsule, Take 1 capsule (240 mg total) by mouth daily., Disp: 90 capsule, Rfl: 0 .  lisinopril (PRINIVIL,ZESTRIL) 40 MG tablet, take 1 tablet by mouth once daily, Disp: 90 tablet, Rfl: 3 .  meloxicam (MOBIC) 15 MG tablet, Take 15 mg by mouth daily., Disp: , Rfl:  .  HYDROcodone-acetaminophen (NORCO/VICODIN) 5-325 MG tablet, hydrocodone 5 mg-acetaminophen 325 mg tablet  Take 1 tablet every 6 hours by oral route as needed., Disp: , Rfl:  .  ranitidine (ZANTAC) 300 MG tablet, Take 1 tablet (300 mg total) by mouth as needed., Disp: 90 tablet, Rfl: 3  Review of Systems  Constitutional: Negative.   Musculoskeletal: Negative.   Skin: Positive for wound. Negative for color change, pallor and rash.  Neurological: Negative for dizziness, light-headedness and headaches.    Social History   Tobacco Use  . Smoking status: Former Smoker    Packs/day: 1.50    Years: 40.00    Pack years: 60.00  . Smokeless tobacco: Never Used  Substance Use Topics  . Alcohol use: No   Objective:   BP 138/66 (BP Location: Right Arm, Patient  Position: Sitting, Cuff Size: Normal)   Pulse 64   Temp 98.2 F (36.8 C) (Oral)   Resp 16   Wt 213 lb (96.6 kg)   BMI 33.36 kg/m  Vitals:   12/05/17 1625  BP: 138/66  Pulse: 64  Resp: 16  Temp: 98.2 F (36.8 C)  TempSrc: Oral  Weight: 213 lb (96.6 kg)     Physical Exam  Constitutional: He is oriented to person, place, and time. He appears well-developed and well-nourished.  Cardiovascular: Normal rate and regular rhythm.  Pulmonary/Chest: Effort normal and breath sounds normal.  Neurological: He is alert and oriented to person, place, and time.  Skin: Skin is warm and dry. Rash noted.     Media Information         Document Information   Photos  Skin rash left armpit   12/05/2017 16:45  Attached To:  Office Visit on 12/05/17 with Trey Sailors, PA-C   Source Information   Maryella Shivers  [facility]           Assessment & Plan:     1. Skin rash  Uncertain of etiology. Does not itch. Will give hard script of abx in case signs of infection emerge over weekend. Instructed patient to call back if developing fevers, chills, blackening of skin.   - amoxicillin-clavulanate (AUGMENTIN) 875-125 MG tablet;  Take 1 tablet by mouth 2 (two) times daily for 7 days.  Dispense: 14 tablet; Refill: 0  Return if symptoms worsen or fail to improve.  The entirety of the information documented in the History of Present Illness, Review of Systems and Physical Exam were personally obtained by me. Portions of this information were initially documented by Kavin Leech, CMA and reviewed by me for thoroughness and accuracy.        Trey Sailors, PA-C  [facility]

## 2017-12-05 NOTE — Patient Instructions (Signed)
Spider Bite Spider bites are not common. Most spider bites do not cause serious problems. There are only a few types of spider bites that can cause serious health problems. Follow these instructions at home: Medicine  Take or apply over-the-counter and prescription medicines only as told by your doctor.  If you were given an antibiotic medicine, take or apply it as told by your doctor. Do not stop using the antibiotic even if your condition improves. General instructions  Do not scratch the bite area.  Keep the bite area clean and dry. Wash the bite area with soap and water every day as told by your doctor.  If directed, apply ice to the bite area. ? Put ice in a plastic bag. ? Place a towel between your skin and the bag. ? Leave the ice on for 20 minutes, 2-3 times per day.  Raise (elevate) the affected area above the level of your heart while you are sitting or lying down, if this is possible.  Keep all follow-up visits as told by your doctor. This is important. Contact a doctor if:  Your bite does not get better after 3 days.  Your bite turns black or purple.  Near the bite, you have: ? Redness. ? Swelling (inflammation). ? Pain that is getting worse. Get help right away if:  You get shortness of breath or chest pain.  You have fluid, blood, or pus coming from the bite area.  You have muscle cramps or painful muscle spasms.  You have stomach (abdominal) pain.  You feel sick to your stomach (nauseous) or you throw up (vomit).  You feel more tired or sleepy than you normally do. This information is not intended to replace advice given to you by your health care provider. Make sure you discuss any questions you have with your health care provider. Document Released: 08/17/2010 Document Revised: 03/11/2016 Document Reviewed: 11/30/2014 Elsevier Interactive Patient Education  2018 Elsevier Inc.  

## 2017-12-17 ENCOUNTER — Ambulatory Visit
Admission: RE | Admit: 2017-12-17 | Discharge: 2017-12-17 | Disposition: A | Discharge status: Home / Self Care | Payer: BLUE CROSS/BLUE SHIELD | Source: Ambulatory Visit | Attending: Physician Assistant | Admitting: Physician Assistant

## 2017-12-17 ENCOUNTER — Ambulatory Visit (INDEPENDENT_AMBULATORY_CARE_PROVIDER_SITE_OTHER): Payer: BLUE CROSS/BLUE SHIELD | Admitting: Physician Assistant

## 2017-12-17 ENCOUNTER — Encounter: Payer: Self-pay | Admitting: Physician Assistant

## 2017-12-17 VITALS — BP 114/64 | HR 68 | Temp 97.7°F | Resp 16 | Wt 215.0 lb

## 2017-12-17 DIAGNOSIS — L089 Local infection of the skin and subcutaneous tissue, unspecified: Secondary | ICD-10-CM

## 2017-12-17 DIAGNOSIS — E8889 Other specified metabolic disorders: Secondary | ICD-10-CM

## 2017-12-17 DIAGNOSIS — S61012A Laceration without foreign body of left thumb without damage to nail, initial encounter: Principal | ICD-10-CM

## 2017-12-17 DIAGNOSIS — X58XXXA Exposure to other specified factors, initial encounter: Secondary | ICD-10-CM | POA: Diagnosis not present

## 2017-12-17 MED ORDER — AMOXICILLIN-POT CLAVULANATE 875-125 MG PO TABS: 1.0000 | ORAL_TABLET | Freq: Two times a day (BID) | ORAL | 0 refills | Status: AC

## 2017-12-17 NOTE — Progress Notes (Signed)
Patient: Maurice Lee Male    DOB: October 31, 1960   57 y.o.   MRN: [id number] Visit Date: 12/17/2017  Today's Provider: Trey Sailors, PA-C   Chief Complaint  Patient presents with  . Hand Injury    Left Thumb   Subjective:    Maurice Lee is a 57 y/o man presenting today with a hand injury. He reports yesterday he was lifting a box of things and stuck his left thumb on a nail. Since then, thumb has become red, swollen, painful to the touch. He also reports a loss of motion in this finger. Denies fevers, chills, nausea, vomiting. Last Tdap given on 06/21/2014.   Hand Injury   The incident occurred 12 to 24 hours ago. The incident occurred at home. Pain location: Left Thumb. The pain does not radiate. The pain has been constant since the incident.       Allergies  Allergen Reactions  . 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 Nabumetone Rash  . Sulfa Antibiotics Rash     Current Outpatient Medications:  .  diltiazem (DILACOR XR) 240 MG 24 hr capsule, Take 1 capsule (240 mg total) by mouth daily., Disp: 90 capsule, Rfl: 0 .  lisinopril (PRINIVIL,ZESTRIL) 40 MG tablet, take 1 tablet by mouth once daily, Disp: 90 tablet, Rfl: 3 .  naproxen sodium (ANAPROX) 550 MG tablet, naproxen sodium 550 mg tablet  Take 1 tablet every 12 hours by oral route., Disp: , Rfl:  .  HYDROcodone-acetaminophen (NORCO/VICODIN) 5-325 MG tablet, hydrocodone 5 mg-acetaminophen 325 mg tablet  Take 1 tablet every 6 hours by oral route as needed., Disp: , Rfl:  .  meloxicam (MOBIC) 15 MG tablet, Take 15 mg by mouth daily., Disp: , Rfl:  .  ranitidine (ZANTAC) 300 MG tablet, Take 1 tablet (300 mg total) by mouth as needed., Disp: 90 tablet, Rfl: 3  Review of Systems  Constitutional: Negative.    Musculoskeletal: Positive for arthralgias and joint swelling. Negative for back pain, gait problem, myalgias, neck pain and neck stiffness.  Skin: Positive for wound. Negative for color change, pallor and rash.    Social History   Tobacco Use  . Smoking status: Former Smoker    Packs/day: 1.50    Years: 40.00    Pack years: 60.00  . Smokeless tobacco: Never Used  Substance Use Topics  . Alcohol use: No   Objective:   BP 114/64 (BP Location: Left Arm, Patient Position: Sitting, Cuff Size: Normal)   Pulse 68   Temp 97.7 F (36.5 C) (Oral)   Resp 16   Wt 215 lb (97.5 kg)   BMI 33.67 kg/m  Vitals:   12/17/17 1058  BP: 114/64  Pulse: 68  Resp: 16  Temp: 97.7 F (36.5 C)  TempSrc: Oral  Weight: 215 lb (97.5 kg)     Physical Exam  Constitutional: He is oriented to person, place, and time. He appears well-developed and well-nourished.  Musculoskeletal:  Sensation grossly in tact in bilateral lower extremities. Patient experiences loss of flexion in his left thumb.    Neurological: He is alert and oriented to person, place, and time.  Skin: Skin is warm and dry. There is erythema.  There is a healing puncture wound on the left thumb with surrounding  erythema, warmth, and tenderness to palpation.       Media Information         Document Information   Photos  Left thumb infection   12/17/2017 11:18  Attached To:  Office Visit on 12/17/17 with Trey Sailors, PA-C   Source Information   Maryella Shivers  [facility]           Assessment & Plan:     1. Laceration of left thumb with infection, initial encounter  - amoxicillin-clavulanate (AUGMENTIN) 875-125 MG tablet; Take 1 tablet by mouth 2 (two) times daily for 7 days.  Dispense: 14 tablet; Refill: 0 - DG Hand Complete Left; Future  2. Serum cholinesterase defect (HCC)  Return in about 2 days (around 12/19/2017) for thumb.  The entirety of the information documented in  the History of Present Illness, Review of Systems and Physical Exam were personally obtained by me. Portions of this information were initially documented by Kavin Leech, CMA and reviewed by me for thoroughness and accuracy.          Trey Sailors, PA-C  [facility]

## 2017-12-17 NOTE — Patient Instructions (Signed)
Wound Infection A wound infection happens when germs start to grow in the wound. Germs that cause wound infections are most often bacteria. Other types of infections can occur as well. In some cases, infection can cause the wound to break open. Wound infections need treatment. If a wound infection is not treated, complications can happen. Follow these instructions at home: Medicines  Take or apply over-the-counter and prescription medicines only as told by your doctor.  If you were prescribed antibiotic medicine, take or apply it as told by your doctor. Do not stop using the antibiotic even if your condition improves. Wound care  Clean the wound each day or as told by your doctor. ? Wash the wound with mild soap and water. ? Rinse the wound with water to remove all soap. ? Pat the wound dry with a clean towel. Do not rub it.  Follow instructions from your doctor about how to take care of your wound. Make sure you: ? Wash your hands with soap and water before you change your bandage (dressing). If you cannot use soap and water, use hand sanitizer. ? Change your bandage as told by your doctor. ? Leave stitches (sutures), skin glue, or skin tape (adhesive) strips in place if your wound has been closed. They may need to stay in place for 2 weeks or longer. If tape strips get loose and curl up, you may trim the loose edges. Do not remove tape strips completely unless your doctor says it is okay. Some wounds are left open to heal on their own.  Check your wound every day for signs of infection. Watch for: ? More redness, swelling, or pain. ? More fluid or blood. ? Warmth. ? Pus or a bad smell. General instructions  Keep the bandage dry until your doctor says it can be removed.  Do not take baths, swim, use a hot tub, or do anything that would put your wound underwater until your doctor says it is okay.  Raise (elevate) the injured area above the level of your heart while you are sitting or  lying down.  Do not scratch or pick at the wound.  Keep all follow-up visits as told by your doctor. This is important. Contact a doctor if:  Medicine does not help your pain.  You have more redness, swelling, or pain in the area of your wound.  You have more fluid or blood coming from your wound.  Your wound feels warm to the touch.  You have pus coming from your wound.  You continue to notice a bad smell coming from your wound or your bandage.  Your wound that was closed breaks open. Get help right away if:  You have a red streak going away from your wound.  You have a fever. This information is not intended to replace advice given to you by your health care provider. Make sure you discuss any questions you have with your health care provider. Document Released: 04/23/2008 Document Revised: 12/21/2015 Document Reviewed: 01/02/2015 Elsevier Interactive Patient Education  2018 Elsevier Inc.  

## 2017-12-18 ENCOUNTER — Telehealth: Payer: Self-pay | Admitting: *Deleted

## 2017-12-18 NOTE — Telephone Encounter (Signed)
-----   Message from Trey Sailors, [place] sent at 12/18/2017 11:22 AM EDT ----- Hand xray normal. See him in clinic tomorrow.

## 2017-12-18 NOTE — Telephone Encounter (Signed)
Patient was notified of results. Expressed understanding.  

## 2017-12-18 NOTE — Telephone Encounter (Signed)
LMOVM for pt to return call 

## 2017-12-19 ENCOUNTER — Ambulatory Visit: Payer: Self-pay | Admitting: Physician Assistant

## 2018-02-04 ENCOUNTER — Other Ambulatory Visit: Payer: Self-pay | Admitting: Physician Assistant

## 2018-02-04 DIAGNOSIS — I10 Essential (primary) hypertension: Secondary | ICD-10-CM

## 2018-03-10 ENCOUNTER — Telehealth: Payer: Self-pay

## 2018-03-10 NOTE — Telephone Encounter (Signed)
Patient had a cortisone injection in April. He is calling to see if he is able to schedule a appointment for another injection. Please advise.  CB# [phone]

## 2018-03-10 NOTE — Telephone Encounter (Signed)
Patient scheduled.

## 2018-03-10 NOTE — Telephone Encounter (Signed)
Yes he is in the window to have another if he needs it

## 2018-03-11 ENCOUNTER — Ambulatory Visit: Payer: BLUE CROSS/BLUE SHIELD | Admitting: Physician Assistant

## 2018-03-11 ENCOUNTER — Encounter: Payer: Self-pay | Admitting: Physician Assistant

## 2018-03-11 VITALS — BP 160/80 | HR 64 | Temp 98.1°F | Resp 16 | Wt 215.0 lb

## 2018-03-11 DIAGNOSIS — M1711 Unilateral primary osteoarthritis, right knee: Secondary | ICD-10-CM | POA: Diagnosis not present

## 2018-03-11 MED ORDER — METHYLPREDNISOLONE ACETATE 80 MG/ML IJ SUSP
80.0000 mg | Freq: Once | INTRAMUSCULAR | Status: AC
  Administered 2018-03-11: 80 mg via INTRA_ARTICULAR

## 2018-03-11 NOTE — Progress Notes (Signed)
Patient: Maurice Lee Male    DOB: 04/05/1961   57 y.o.   MRN: [id number] Visit Date: 03/11/2018  Today's Provider: Margaretann LovelessJennifer M Bryttani Blew, PA-C   Chief Complaint  Patient presents with  . Knee Pain   Subjective:    HPI Patient here today C/O recurrent right knee pain. Patient requesting to have cortisone injection today. Patient has had recurrent right knee pain from OA. He made last injection last 4 months. He has had TKR of the left knee and reports it is doing well.     Allergies  Allergen Reactions  . 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 Nabumetone Rash  . Sulfa Antibiotics Rash     Current Outpatient Medications:  .  diltiazem (DILACOR XR) 240 MG 24 hr capsule, Take 1 capsule (240 mg total) by mouth daily., Disp: 90 capsule, Rfl: 0 .  lisinopril (PRINIVIL,ZESTRIL) 40 MG tablet, take 1 tablet by mouth once daily, Disp: 90 tablet, Rfl: 3 .  naproxen sodium (ANAPROX) 550 MG tablet, naproxen sodium 550 mg tablet  Take 1 tablet every 12 hours by oral route., Disp: , Rfl:  .  ranitidine (ZANTAC) 300 MG tablet, Take 1 tablet (300 mg total) by mouth as needed., Disp: 90 tablet, Rfl: 3  Review of Systems  Constitutional: Negative.   Respiratory: Negative.   Cardiovascular: Negative.   Gastrointestinal: Negative.   Musculoskeletal: Positive for arthralgias and joint swelling.  Neurological: Negative.     Social History   Tobacco Use  . Smoking status: Former Smoker    Packs/day: 1.50    Years: 40.00    Pack years: 60.00  . Smokeless tobacco: Never Used  Substance Use Topics  . Alcohol use: No   Objective:   BP (!) 160/80 (BP Location: Left Arm, Patient Position: Sitting, Cuff Size: Large)   Pulse 64   Temp 98.1 F (36.7 C) (Oral)   Resp 16   Wt  215 lb (97.5 kg)   SpO2 98%   BMI 33.67 kg/m  Vitals:   03/11/18 1552  BP: (!) 160/80  Pulse: 64  Resp: 16  Temp: 98.1 F (36.7 C)  TempSrc: Oral  SpO2: 98%  Weight: 215 lb (97.5 kg)     Physical Exam  Constitutional: He appears well-developed and well-nourished. No distress.  HENT:  Head: Normocephalic and atraumatic.  Neck: Normal range of motion. Neck supple.  Cardiovascular: Normal rate, regular rhythm and normal heart sounds. Exam reveals no gallop and no friction rub.  No murmur heard. Pulmonary/Chest: Effort normal and breath sounds normal. No respiratory distress. He has no wheezes. He has no rales.  Musculoskeletal:       Right knee: He exhibits decreased range of motion and swelling. He exhibits no effusion, no LCL laxity, normal patellar mobility, normal meniscus and no MCL laxity. Tenderness found. Medial joint line tenderness noted.  Skin: He is not diaphoretic.  Vitals reviewed.      Assessment & Plan:     1. Primary osteoarthritis of right knee Steroid injection given as below without complication. See procedure note. Call if any complications.  - methylPREDNISolone acetate (DEPO-MEDROL) injection 80 mg  Procedure Note: Benefits, risks (including infection, tattooing, adipose dimpling, and tendon rupture) and alternatives were explained to the patient. All questions  were sought and answered.  Patient agreed to continue and verbal consent was obtained.   An aspiration and steroid injection was performed on right knee using 4cc of 1% plain Xyloocaine and 80 mg of depo-medrol. There was minimal bleeding. Hemostasis was intact. A dry dressing was applied. The procedure was well tolerated.       Margaretann LovelessJennifer M Lyrique Hakim, PA-C  [facility]

## 2018-03-11 NOTE — Patient Instructions (Signed)
Knee Injection, Care After  Refer to this sheet in the next few weeks. These instructions provide you with information about caring for yourself after your procedure. Your health care provider may also give you more specific instructions. Your treatment has been planned according to current medical practices, but problems sometimes occur. Call your health care provider if you have any problems or questions after your procedure.  What can I expect after the procedure?  After the procedure, it is common to have:   Soreness.   Warmth.   Swelling.    You may have more pain, swelling, and warmth than you did before the injection. This reaction may last for about one day.  Follow these instructions at home:  Bathing   If you were given a bandage (dressing), keep it dry until your health care provider says it can be removed. Ask your health care provider when you can start showering or taking a bath.  Managing pain, stiffness, and swelling   If directed, apply ice to the injection area:  ? Put ice in a plastic bag.  ? Place a towel between your skin and the bag.  ? Leave the ice on for 20 minutes, 2-3 times per day.   Do not apply heat to your knee.   Raise the injection area above the level of your heart while you are sitting or lying down.  Activity   Avoid strenuous activities for as long as directed by your health care provider. Ask your health care provider when you can return to your normal activities.  General instructions   Take medicines only as directed by your health care provider.   Do not take aspirin or other over-the-counter medicines unless your health care provider says you can.   Check your injection site every day for signs of infection. Watch for:  ? Redness, swelling, or pain.  ? Fluid, blood, or pus.   Follow your health care provider's instructions about dressing changes and removal.  Contact a health care provider if:   You have symptoms at your injection site that last longer than  two days after your procedure.   You have redness, swelling, or pain in your injection area.   You have fluid, blood, or pus coming from your injection site.   You have warmth in your injection area.   You have a fever.   Your pain is not controlled with medicine.  Get help right away if:   Your knee turns very red.   Your knee becomes very swollen.   Your knee pain is severe.  This information is not intended to replace advice given to you by your health care provider. Make sure you discuss any questions you have with your health care provider.  Document Released: 08/05/2014 Document Revised: 03/20/2016 Document Reviewed: 05/25/2014  Elsevier Interactive Patient Education  2018 Elsevier Inc.

## 2018-04-30 ENCOUNTER — Ambulatory Visit: Payer: BLUE CROSS/BLUE SHIELD | Admitting: Physician Assistant

## 2018-04-30 ENCOUNTER — Ambulatory Visit
Admission: RE | Admit: 2018-04-30 | Discharge: 2018-04-30 | Disposition: A | Discharge status: Home / Self Care | Payer: BLUE CROSS/BLUE SHIELD | Source: Ambulatory Visit | Attending: Physician Assistant | Admitting: Physician Assistant

## 2018-04-30 ENCOUNTER — Encounter: Payer: Self-pay | Admitting: Physician Assistant

## 2018-04-30 VITALS — BP 132/60 | HR 62 | Temp 97.0°F | Resp 16 | Wt 217.0 lb

## 2018-04-30 DIAGNOSIS — T753XXA Motion sickness, initial encounter: Secondary | ICD-10-CM

## 2018-04-30 DIAGNOSIS — M545 Low back pain, unspecified: Secondary | ICD-10-CM

## 2018-04-30 DIAGNOSIS — M5136 Other intervertebral disc degeneration, lumbar region: Secondary | ICD-10-CM | POA: Insufficient documentation

## 2018-04-30 DIAGNOSIS — N200 Calculus of kidney: Secondary | ICD-10-CM | POA: Diagnosis not present

## 2018-04-30 LAB — POCT URINALYSIS DIPSTICK
BILIRUBIN UA: NEGATIVE
GLUCOSE UA: NEGATIVE
KETONES UA: NEGATIVE
Leukocytes, UA: NEGATIVE
Nitrite, UA: NEGATIVE
Odor: NEGATIVE
PH UA: 6.5 (ref 5.0–8.0)
Protein, UA: NEGATIVE
RBC UA: NEGATIVE
SPEC GRAV UA: 1.015 (ref 1.010–1.025)
UROBILINOGEN UA: 0.2 U/dL

## 2018-04-30 MED ORDER — METHOCARBAMOL 500 MG PO TABS: 500.0000 mg | ORAL_TABLET | Freq: Three times a day (TID) | ORAL | 0 refills | Status: DC | PRN

## 2018-04-30 MED ORDER — SCOPOLAMINE 1 MG/3DAYS TD PT72: 1.0000 | MEDICATED_PATCH | TRANSDERMAL | 0 refills | Status: DC

## 2018-04-30 MED ORDER — METHYLPREDNISOLONE 4 MG PO TBPK: ORAL_TABLET | ORAL | 0 refills | Status: DC
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
## 2018-05-01 ENCOUNTER — Telehealth: Payer: Self-pay | Admitting: Physician Assistant

## 2018-05-01 NOTE — Telephone Encounter (Signed)
Patient advised.

## 2018-05-01 NOTE — Telephone Encounter (Signed)
Pt is requesting a call back to get his Xray results. Pt stated he can see them via MyChart but he would like the results explained because he doesn't understand. Please advise. Thanks TNP

## 2018-05-06 ENCOUNTER — Ambulatory Visit: Payer: BLUE CROSS/BLUE SHIELD | Admitting: Family Medicine

## 2018-05-06 ENCOUNTER — Encounter: Payer: Self-pay | Admitting: Physician Assistant

## 2018-05-06 ENCOUNTER — Encounter: Payer: Self-pay | Admitting: Family Medicine

## 2018-05-06 VITALS — BP 148/86 | HR 70 | Temp 97.9°F | Resp 16 | Wt 220.6 lb

## 2018-05-06 DIAGNOSIS — S0181XA Laceration without foreign body of other part of head, initial encounter: Secondary | ICD-10-CM | POA: Diagnosis not present

## 2018-05-06 DIAGNOSIS — S0993XA Unspecified injury of face, initial encounter: Secondary | ICD-10-CM | POA: Diagnosis not present

## 2018-05-06 NOTE — Progress Notes (Signed)
Patient: Maurice Lee Male    DOB: 02-Jul-1961   57 y.o.   MRN: [id number] Visit Date: 05/06/2018  Today's Provider: Shirlee Latch, MD   Chief Complaint  Patient presents with  . Laceration   Subjective:    Laceration   The incident occurred less than 1 hour ago. The laceration is located on the face. The laceration is 2 cm in size. The laceration mechanism was a blunt object (hit in face by board). The pain is at a severity of 10/10. The pain is moderate. He reports no foreign bodies present. His tetanus status is UTD (06/21/2014).   Bridge of nose is lacerated with surrounding abrasion.  No change in vision, no eye pain/redness.    Allergies  Allergen Reactions  . 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 Nabumetone Rash  . Sulfa Antibiotics Rash     Current Outpatient Medications:  .  diltiazem (DILACOR XR) 240 MG 24 hr capsule, Take 1 capsule (240 mg total) by mouth daily., Disp: 90 capsule, Rfl: 0 .  lisinopril (PRINIVIL,ZESTRIL) 40 MG tablet, take 1 tablet by mouth once daily, Disp: 90 tablet, Rfl: 3 .  methocarbamol (ROBAXIN) 500 MG tablet, Take 1 tablet (500 mg total) by mouth every 8 (eight) hours as needed for muscle spasms., Disp: 30 tablet, Rfl: 0 .  naproxen sodium (ANAPROX) 550 MG tablet, naproxen sodium 550 mg tablet  Take 1 tablet every 12 hours by oral route., Disp: , Rfl:  .  ranitidine (ZANTAC) 300 MG tablet, Take 1 tablet (300 mg total) by mouth as needed., Disp: 90 tablet, Rfl: 3 .  scopolamine (TRANSDERM-SCOP) 1 MG/3DAYS, Place 1 patch (1.5 mg total) onto the skin every 3 (three) days. (Patient not taking: Reported on 05/06/2018), Disp: 4 patch, Rfl: 0  Review of Systems  Constitutional: Negative.   HENT: Negative.   Eyes: Positive for  pain, redness and visual disturbance.  Respiratory: Negative.   Gastrointestinal: Negative.   Endocrine: Negative.   Genitourinary: Negative.   Musculoskeletal: Negative.   Allergic/Immunologic: Negative.   Neurological: Positive for headaches.  Psychiatric/Behavioral: Negative.     Social History   Tobacco Use  . Smoking status: Former Smoker    Packs/day: 1.50    Years: 40.00    Pack years: 60.00  . Smokeless tobacco: Never Used  Substance Use Topics  . Alcohol use: No   Objective:   There were no vitals taken for this visit. There were no vitals filed for this visit.   Physical Exam  Constitutional: He is oriented to person, place, and time. He appears well-developed and well-nourished. No distress.  HENT:  Head: Normocephalic and atraumatic.  Right Ear: External ear normal.  Left Ear: External ear normal.  Mouth/Throat: Oropharynx is clear and moist. No oropharyngeal exudate.  Small shallow laceration on bridge of nose with adjacent abrasion.  Bruising under R eye. No active bleeding  Eyes: Pupils are equal, round, and reactive to light. Conjunctivae and EOM are normal. Right eye exhibits no discharge. Left eye exhibits no discharge. No scleral icterus.  Cardiovascular: Normal rate and regular rhythm.  Pulmonary/Chest: Effort normal. No respiratory distress.  Musculoskeletal: He exhibits no edema.  Neurological: He is alert and oriented to person, place, and time.  Skin: Skin is warm and  dry. Capillary refill takes less than 2 seconds. No rash noted.  Psychiatric: He has a normal mood and affect. His behavior is normal.  Vitals reviewed.         Assessment & Plan:   1. Facial laceration, initial encounter 2. Facial injury, initial encounter -Small facial laceration that is very shallow and more abrasion like - No foreign body in the eye and no eye pain or red flag symptoms - No signs of infection - No need for suturing given that bleeding is stopped and  wound is quite shallow -Discussed keeping the area clean and dry - Discussed wearing eye protection -Discussed red flag symptoms that would signal he had an eye injury that would mean he needs to see an ophthalmologist -Discussed return precautions   Return if symptoms worsen or fail to improve.   The entirety of the information documented in the History of Present Illness, Review of Systems and Physical Exam were personally obtained by me. Portions of this information were initially documented by Nadene Rubins, CMA and reviewed by me for thoroughness and accuracy.    Erasmo Downer, MD, MPH [facility] 05/06/2018 10:53 AM

## 2018-05-25 ENCOUNTER — Encounter: Payer: Self-pay | Admitting: Family Medicine

## 2018-05-25 ENCOUNTER — Ambulatory Visit: Payer: BLUE CROSS/BLUE SHIELD | Admitting: Family Medicine

## 2018-05-25 VITALS — BP 142/70 | HR 68 | Temp 98.2°F | Resp 16 | Wt 218.2 lb

## 2018-05-25 DIAGNOSIS — J069 Acute upper respiratory infection, unspecified: Secondary | ICD-10-CM | POA: Diagnosis not present

## 2018-05-25 DIAGNOSIS — B9789 Other viral agents as the cause of diseases classified elsewhere: Secondary | ICD-10-CM | POA: Diagnosis not present

## 2018-05-25 NOTE — Patient Instructions (Signed)
Delsym cough syrup, Mucinex, tylenol/naproxen, nasal saline  Upper Respiratory Infection, Adult Most upper respiratory infections (URIs) are caused by a virus. A URI affects the nose, throat, and upper air passages. The most common type of URI is often called "the common cold." Follow these instructions at home:  Take medicines only as told by your doctor.  Gargle warm saltwater or take cough drops to comfort your throat as told by your doctor.  Use a warm mist humidifier or inhale steam from a shower to increase air moisture. This may make it easier to breathe.  Drink enough fluid to keep your pee (urine) clear or pale yellow.  Eat soups and other clear broths.  Have a healthy diet.  Rest as needed.  Go back to work when your fever is gone or your doctor says it is okay. ? You may need to stay home longer to avoid giving your URI to others. ? You can also wear a face mask and wash your hands often to prevent spread of the virus.  Use your inhaler more if you have asthma.  Do not use any tobacco products, including cigarettes, chewing tobacco, or electronic cigarettes. If you need help quitting, ask your doctor. Contact a doctor if:  You are getting worse, not better.  Your symptoms are not helped by medicine.  You have chills.  You are getting more short of breath.  You have brown or red mucus.  You have yellow or brown discharge from your nose.  You have pain in your face, especially when you bend forward.  You have a fever.  You have puffy (swollen) neck glands.  You have pain while swallowing.  You have white areas in the back of your throat. Get help right away if:  You have very bad or constant: ? Headache. ? Ear pain. ? Pain in your forehead, behind your eyes, and over your cheekbones (sinus pain). ? Chest pain.  You have long-lasting (chronic) lung disease and any of the following: ? Wheezing. ? Long-lasting cough. ? Coughing up blood. ? A change  in your usual mucus.  You have a stiff neck.  You have changes in your: ? Vision. ? Hearing. ? Thinking. ? Mood. This information is not intended to replace advice given to you by your health care provider. Make sure you discuss any questions you have with your health care provider. Document Released: 01/01/2008 Document Revised: 03/17/2016 Document Reviewed: 10/20/2013 Elsevier Interactive Patient Education  2018 [employer].

## 2018-05-25 NOTE — Progress Notes (Signed)
Patient: Maurice Lee Male    DOB: [birth year]   57 y.o.   MRN: [id number] Visit Date: 05/25/2018  Today's Provider: Shirlee Latch, MD   Chief Complaint  Patient presents with  . URI   Subjective:    URI   This is a new problem. The current episode started in the past 7 days (Started on Saturday). The problem has been unchanged. There has been no fever. Associated symptoms include congestion, coughing, headaches, rhinorrhea and a sore throat ("better today"). Pertinent negatives include no chest pain, ear pain or wheezing. Associated symptoms comments: Feels warm. Treatments tried: mucinex. The treatment provided no relief.   Cough is non-productive.  Feels a constant tickle and then can't stop coughing    Allergies  Allergen Reactions  . 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 Nabumetone Rash  . Sulfa Antibiotics Rash     Current Outpatient Medications:  .  lisinopril (PRINIVIL,ZESTRIL) 40 MG tablet, take 1 tablet by mouth once daily, Disp: 90 tablet, Rfl: 3 .  methocarbamol (ROBAXIN) 500 MG tablet, Take 1 tablet (500 mg total) by mouth every 8 (eight) hours as needed for muscle spasms., Disp: 30 tablet, Rfl: 0 .  naproxen sodium (ANAPROX) 550 MG tablet, naproxen sodium 550 mg tablet  Take 1 tablet every 12 hours by oral route., Disp: , Rfl:  .  diltiazem (DILACOR XR) 240 MG 24 hr capsule, Take 1 capsule (240 mg total) by mouth daily., Disp: 90 capsule, Rfl: 0 .  ranitidine (ZANTAC) 300 MG tablet, Take 1 tablet (300 mg total) by mouth as needed., Disp: 90 tablet, Rfl: 3  Review of Systems  Constitutional: Positive for fever.  HENT: Positive for congestion, postnasal drip, rhinorrhea, sinus pressure and sore throat ("better today"). Negative for ear pain and  trouble swallowing.   Respiratory: Positive for cough, chest tightness and shortness of breath. Negative for wheezing.   Cardiovascular: Negative.  Negative for chest pain, palpitations and leg swelling.  Musculoskeletal: Negative.   Neurological: Positive for weakness and headaches.    Social History   Tobacco Use  . Smoking status: Former Smoker    Packs/day: 1.50    Years: 40.00    Pack years: 60.00  . Smokeless tobacco: Never Used  Substance Use Topics  . Alcohol use: No   Objective:   BP (!) 142/70 (BP Location: Left Arm, Patient Position: Sitting, Cuff Size: Large)   Pulse 68   Temp 98.2 F (36.8 C) (Oral)   Resp 16   Wt 218 lb 3.2 oz (99 kg)   SpO2 97%   BMI 34.17 kg/m  Vitals:   05/25/18 1051  BP: (!) 142/70  Pulse: 68  Resp: 16  Temp: 98.2 F (36.8 C)  TempSrc: Oral  SpO2: 97%  Weight: 218 lb 3.2 oz (99 kg)     Physical Exam  Constitutional: Maurice Lee is oriented to person, place, and time. Maurice Lee appears well-developed and well-nourished. No distress.  HENT:  Head: Normocephalic and atraumatic.  Right Ear: Tympanic membrane, external ear and ear canal normal.  Left Ear: Tympanic membrane, external ear and ear canal normal.  Nose: Mucosal edema present. Right sinus exhibits no maxillary sinus tenderness and no frontal sinus tenderness. Left sinus exhibits no maxillary sinus tenderness and no frontal  sinus tenderness.  Mouth/Throat: Uvula is midline and mucous membranes are normal. Posterior oropharyngeal erythema present. No oropharyngeal exudate or posterior oropharyngeal edema.  Eyes: Pupils are equal, round, and reactive to light. Conjunctivae are normal. Right eye exhibits no discharge. Left eye exhibits no discharge. No scleral icterus.  Neck: Neck supple. No thyromegaly present.  Cardiovascular: Normal rate, regular rhythm, normal heart sounds and intact distal pulses.  No murmur heard. Pulmonary/Chest: Effort normal and breath sounds normal. No respiratory  distress. Maurice Lee has no wheezes. Maurice Lee has no rales.  Musculoskeletal: Maurice Lee exhibits no edema.  Lymphadenopathy:    Maurice Lee has no cervical adenopathy.  Neurological: Maurice Lee is alert and oriented to person, place, and time.  Skin: Skin is warm and dry. Capillary refill takes less than 2 seconds. No rash noted.  Psychiatric: Maurice Lee has a normal mood and affect. His behavior is normal.  Vitals reviewed.       Assessment & Plan:   1. Viral URI with cough - symptoms and exam c/w viral URI - no evidence of strep pharyngitis, CAP, AOM, bacterial sinusitis, or other bacterial infection - discussed symptomatic management, natural course, and return precautions   Return if symptoms worsen or fail to improve.   The entirety of the information documented in the History of Present Illness, Review of Systems and Physical Exam were personally obtained by me. Portions of this information were initially documented by Joseline Rosas and Presley Raddle, CMA and reviewed by me for thoroughness and accuracy.    Erasmo Downer, MD, MPH [facility] 05/25/2018 11:12 AM

## 2018-06-12 ENCOUNTER — Encounter: Payer: Self-pay | Admitting: Physician Assistant

## 2018-06-12 NOTE — Telephone Encounter (Signed)
Josie can you please call since we have that meeting today.  Thank you! JB

## 2018-06-15 NOTE — Telephone Encounter (Signed)
He needs to schedule an appt for us to check BP bilaterally and so I can order appropriate testing.

## 2018-06-15 NOTE — Telephone Encounter (Signed)
Spoke with patient and he reports that the bp on his left arm is reading low 115/70 or 111/70 and that in his right arm is reading Normal 130/70. He reports that his mother had Subclavia Steal Syndrome. He is wondering if that is what can be happening to him.

## 2018-06-17 ENCOUNTER — Ambulatory Visit (INDEPENDENT_AMBULATORY_CARE_PROVIDER_SITE_OTHER): Payer: BLUE CROSS/BLUE SHIELD | Admitting: Physician Assistant

## 2018-06-17 ENCOUNTER — Encounter: Payer: Self-pay | Admitting: Physician Assistant

## 2018-06-17 VITALS — BP 133/76 | HR 71 | Temp 97.6°F | Resp 16 | Wt 219.0 lb

## 2018-06-17 DIAGNOSIS — I10 Essential (primary) hypertension: Secondary | ICD-10-CM

## 2018-06-17 NOTE — Progress Notes (Signed)
Patient: Maurice Lee Male    DOB: [birth year]   57 y.o.   MRN: [id number] Visit Date: 06/17/2018  Today's Provider: Margaretann Loveless, PA-C   Chief Complaint  Patient presents with  . Hypertension   Subjective:    HPI Patient here with c/o getting different blood pressure readings in both arms at home. He reports that he stopped his blood pressure medication Diltiazem. He also reports that his blood pressure readings on his left arm are on the low side, 111/70 today, and on the right is usually 130/70. He reports that his mother had subclavian steal syndrome.  He has not had any symptoms of upper extremity claudication or lightheadedness/passing out with using his arms.   Patients bp machine (Large cuff and sitting)Right arm 133/77 and Left arm 144/81     Allergies  Allergen Reactions  . 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 Nabumetone Rash  . Sulfa Antibiotics Rash     Current Outpatient Medications:  .  lisinopril (PRINIVIL,ZESTRIL) 40 MG tablet, take 1 tablet by mouth once daily, Disp: 90 tablet, Rfl: 3 .  diltiazem (DILACOR XR) 240 MG 24 hr capsule, Take 1 capsule (240 mg total) by mouth daily., Disp: 90 capsule, Rfl: 0 .  methocarbamol (ROBAXIN) 500 MG tablet, Take 1 tablet (500 mg total) by mouth every 8 (eight) hours as needed for muscle spasms. (Patient not taking: Reported on 06/17/2018), Disp: 30 tablet, Rfl: 0 .  naproxen sodium (ANAPROX) 550 MG tablet, naproxen sodium 550 mg tablet  Take 1 tablet every 12 hours by oral route., Disp: , Rfl:  .  ranitidine (ZANTAC) 300 MG tablet, Take 1 tablet (300 mg total) by mouth as needed., Disp: 90 tablet, Rfl: 3  Review of Systems  Constitutional: Negative.   Respiratory: Negative.   Cardiovascular:  Negative.   Musculoskeletal: Negative.   Neurological: Negative.     Social History   Tobacco Use  . Smoking status: Former Smoker    Packs/day: 1.50    Years: 40.00    Pack years: 60.00  . Smokeless tobacco: Never Used  Substance Use Topics  . Alcohol use: No   Objective:   BP 133/76 (BP Location: Left Arm, Patient Position: Sitting, Cuff Size: Large) Comment: right arm Large cuff Sitting 134/75  Pulse 71   Temp 97.6 F (36.4 C) (Oral)   Resp 16   Wt 219 lb (99.3 kg)   BMI 34.30 kg/m  Vitals:   06/17/18 0700  BP: 133/76  Pulse: 71  Resp: 16  Temp: 97.6 F (36.4 C)  TempSrc: Oral  Weight: 219 lb (99.3 kg)     Physical Exam  Constitutional: He appears well-developed and well-nourished. No distress.  HENT:  Head: Normocephalic and atraumatic.  Neck: Normal range of motion. Neck supple.  Cardiovascular: Normal rate, regular rhythm, normal heart sounds and intact distal pulses. Exam reveals no gallop and no friction rub.  No murmur heard. Pulmonary/Chest: Effort normal and breath sounds normal. No respiratory distress. He has no wheezes. He has no rales.  Skin: He is not diaphoretic.  Vitals reviewed.       Assessment & Plan:     1. Essential (primary) hypertension No BP differential today in upper extremities. Discussed warning signs and he is  to call if these occur.        Margaretann LovelessJennifer M Audree Schrecengost, PA-C  [facility]

## 2018-07-03 ENCOUNTER — Ambulatory Visit: Payer: BLUE CROSS/BLUE SHIELD | Admitting: Physician Assistant

## 2018-07-03 ENCOUNTER — Encounter: Payer: Self-pay | Admitting: Physician Assistant

## 2018-07-03 VITALS — BP 140/60 | HR 70 | Temp 97.5°F | Resp 16 | Wt 217.6 lb

## 2018-07-03 DIAGNOSIS — M1711 Unilateral primary osteoarthritis, right knee: Secondary | ICD-10-CM

## 2018-07-03 NOTE — Progress Notes (Signed)
Patient: Maurice Lee Male    DOB: Oct 26, 1960   57 y.o.   MRN: [id number] Visit Date: 07/03/2018  Today's Provider: Margaretann Loveless, PA-C   Chief Complaint  Patient presents with  . Osteoarthritis   Subjective:    HPI   Patient returns to office today requesting cortisone injection in his right knee for osteoarthritis. 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     Allergies  Allergen Reactions  . 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 Nabumetone Rash  . Sulfa Antibiotics Rash     Current Outpatient Medications:  .  diltiazem (DILACOR XR) 240 MG 24 hr capsule, Take 1 capsule (240 mg total) by mouth daily., Disp: 90 capsule, Rfl: 0 .  lisinopril (PRINIVIL,ZESTRIL) 40 MG tablet, take 1 tablet by mouth once daily, Disp: 90 tablet, Rfl: 3 .  naproxen sodium (ANAPROX) 550 MG tablet, naproxen sodium 550 mg tablet  Take 1 tablet every 12 hours by oral route., Disp: , Rfl:  .  methocarbamol (ROBAXIN) 500 MG tablet, Take 1 tablet (500 mg total) by mouth every 8 (eight) hours as needed for muscle spasms. (Patient not taking: Reported on 07/03/2018), Disp: 30 tablet, Rfl: 0 .  ranitidine (ZANTAC) 300 MG tablet, Take 1 tablet (300 mg total) by mouth as needed., Disp: 90 tablet, Rfl: 3  Review of Systems  Constitutional: Negative.   HENT: Negative.   Eyes: Negative.   Respiratory: Negative.   Cardiovascular: Negative.   Gastrointestinal: Negative.   Endocrine: Negative.   Musculoskeletal: Positive for arthralgias, joint swelling and myalgias. Negative for back pain, gait problem, neck pain and neck stiffness.  Skin: Negative.   Hematological: Negative.   Psychiatric/Behavioral: Negative.       Social History   Tobacco Use  . Smoking status: Former Smoker    Packs/day: 1.50    Years: 40.00    Pack years: 60.00  . Smokeless tobacco: Never Used  Substance Use Topics  . Alcohol use: No   Objective:   BP 140/60   Pulse 70   Temp (!) 97.5 F (36.4 C) (Oral)   Resp 16   Wt 217 lb 9.6 oz (98.7 kg)   BMI 34.08 kg/m  Vitals:   07/03/18 1648  BP: 140/60  Pulse: 70  Resp: 16  Temp: (!) 97.5 F (36.4 C)  TempSrc: Oral  Weight: 217 lb 9.6 oz (98.7 kg)     Physical Exam  Constitutional: He appears well-developed and well-nourished. No distress.  HENT:  Head: Normocephalic and atraumatic.  Neck: Normal range of motion. Neck supple.  Cardiovascular: Normal rate, regular rhythm and normal heart sounds. Exam reveals no gallop and no friction rub.  No murmur heard. Pulmonary/Chest: Effort normal and breath sounds normal. No respiratory distress. He has no wheezes. He has no rales.  Musculoskeletal:       Right knee: He exhibits normal range of motion, no swelling, no effusion, no LCL laxity, normal patellar mobility, no bony tenderness, normal meniscus and no MCL laxity. Tenderness found. Medial joint line and lateral joint line tenderness noted.  Skin: He is not diaphoretic.  Vitals reviewed.       Assessment & Plan:  1. Primary osteoarthritis of right knee Steroid injection given to right knee, see procedure note below. No complications.  - methylPREDNISolone acetate (DEPO-MEDROL) injection 80 mg   Procedure Note: Benefits, risks (including infection, tattooing, adipose dimpling, and tendon rupture) and alternatives were explained to the patient. All questions were sought and answered.  Patient agreed to continue and verbal consent was obtained.   An aspiration and steroid injection was performed on right knee using 4cc of 1% plain Xyloocaine and 80 mg of depo-medrol (2-40mg  vials used). There was minimal bleeding. Hemostasis was intact. A dry dressing was  applied. The procedure was well tolerated.       Margaretann LovelessJennifer M Khanh Tanori, PA-C  [facility]

## 2018-07-06 DIAGNOSIS — M1711 Unilateral primary osteoarthritis, right knee: Secondary | ICD-10-CM | POA: Diagnosis not present

## 2018-07-06 MED ORDER — METHYLPREDNISOLONE ACETATE 40 MG/ML IJ SUSP
80.0000 mg | Freq: Once | INTRAMUSCULAR | Status: AC
  Administered 2018-07-06: 80 mg via INTRA_ARTICULAR

## 2018-07-10 ENCOUNTER — Other Ambulatory Visit: Payer: Self-pay | Admitting: Physician Assistant

## 2018-07-10 DIAGNOSIS — M1711 Unilateral primary osteoarthritis, right knee: Secondary | ICD-10-CM

## 2018-07-10 DIAGNOSIS — T753XXA Motion sickness, initial encounter: Secondary | ICD-10-CM

## 2018-07-10 MED ORDER — SCOPOLAMINE 1 MG/3DAYS TD PT72: 1.0000 | MEDICATED_PATCH | TRANSDERMAL | 12 refills | Status: DC

## 2018-07-10 MED ORDER — DICLOFENAC SODIUM 1 % TD GEL: 4.0000 g | Freq: Four times a day (QID) | TRANSDERMAL | 0 refills | Status: DC

## 2018-07-10 NOTE — Progress Notes (Signed)
meds refilled 

## 2018-07-13 ENCOUNTER — Other Ambulatory Visit: Payer: Self-pay | Admitting: Physician Assistant

## 2018-07-13 DIAGNOSIS — M1711 Unilateral primary osteoarthritis, right knee: Secondary | ICD-10-CM

## 2018-11-12 ENCOUNTER — Encounter: Payer: Self-pay | Admitting: Physician Assistant

## 2018-11-12 ENCOUNTER — Other Ambulatory Visit: Payer: Self-pay

## 2018-11-12 ENCOUNTER — Ambulatory Visit (INDEPENDENT_AMBULATORY_CARE_PROVIDER_SITE_OTHER): Payer: BLUE CROSS/BLUE SHIELD | Admitting: Physician Assistant

## 2018-11-12 VITALS — BP 125/74 | HR 65 | Temp 97.7°F | Resp 18 | Wt 226.0 lb

## 2018-11-12 DIAGNOSIS — M170 Bilateral primary osteoarthritis of knee: Secondary | ICD-10-CM | POA: Diagnosis not present

## 2018-11-12 MED ORDER — METHYLPREDNISOLONE ACETATE 40 MG/ML IJ SUSP
80.0000 mg | Freq: Once | INTRAMUSCULAR | Status: AC
  Administered 2018-11-12: 80 mg via INTRA_ARTICULAR

## 2018-11-12 NOTE — Patient Instructions (Signed)
Knee Injection  A knee injection is a procedure to get medicine into your knee joint to relieve the pain, swelling, and stiffness of arthritis. Your health care provider uses a needle to inject medicine, which may also help to lubricate and cushion your knee joint. You may need more than one injection.  Tell a health care provider about:  Any allergies you have.  All medicines you are taking, including vitamins, herbs, eye drops, creams, and over-the-counter medicines.  Any problems you or family members have had with anesthetic medicines.  Any blood disorders you have.  Any surgeries you have had.  Any medical conditions you have.  Whether you are pregnant or may be pregnant.  What are the risks?  Generally, this is a safe procedure. However, problems may occur, including:  Infection.  Bleeding.  Symptoms that get worse.  Damage to the area around your knee.  Allergic reaction to any of the medicines.  Skin reactions from repeated injections.  What happens before the procedure?  Ask your health care provider about changing or stopping your regular medicines. This is especially important if you are taking diabetes medicines or blood thinners.  Plan to have someone take you home from the hospital or clinic.  What happens during the procedure?    You will sit or lie down in a position for your knee to be treated.  The skin over your kneecap will be cleaned with a germ-killing soap.  You will be given a medicine that numbs the area (local anesthetic). You may feel some stinging.  The medicine will be injected into your knee. The needle is carefully placed between your kneecap and your knee. The medicine is injected into the joint space.  The needle will be removed at the end of the procedure.  A bandage (dressing) may be placed over the injection site.  The procedure may vary among health care providers and hospitals.  What can I expect after the procedure?  Your blood pressure, heart rate, breathing rate, and blood  oxygen level will be monitored until you leave the hospital or clinic.  You may have to move your knee through its full range of motion. This helps to get all the medicine into your joint space.  You will be watched to make sure that you do not have a reaction to the injected medicine.  You may feel more pain, swelling, and warmth than you did before the injection. This reaction may last about 1-2 days.  Follow these instructions at home:  Medicines  Take over-the-counter and prescription medicines only as told by your doctor.  Do not drive or use heavy machinery while taking prescription pain medicine.  Do not take medicines such as aspirin and ibuprofen unless your health care provider tells you to take them.  Injection site care  Follow instructions from your health care provider about:  How to take care of your puncture site.  When and how you should change your dressing.  When you should remove your dressing.  Check your injection area every day for signs of infection. Check for:  More redness, swelling, or pain after 2 days.  Fluid or blood.  Pus or a bad smell.  Warmth.  Managing pain, stiffness, and swelling    If directed, put ice on the injection area:  Put ice in a plastic bag.  Place a towel between your skin and the bag.  Leave the ice on for 20 minutes, 2-3 times per day.  Do not   apply heat to your knee.  Raise (elevate) the injection area above the level of your heart while you are sitting or lying down.  General instructions  If you were given a dressing, keep it dry until your health care provider says it can be removed. Ask your health care provider when you can start showering or taking a bath.  Avoid strenuous activities for as long as directed by your health care provider. Ask your health care provider when you can return to your normal activities.  Keep all follow-up visits as told by your health care provider. This is important. You may need more injections.  Contact a health care provider if  you have:  A fever.  Warmth in your injection area.  Fluid, blood, or pus coming from your injection site.  Symptoms at your injection site that last longer than 2 days after your procedure.  Get help right away if:  Your knee:  Turns very red.  Becomes very swollen.  Is in severe pain.  Summary  A knee injection is a procedure to get medicine into your knee joint to relieve the pain, swelling, and stiffness of arthritis.  A needle is carefully placed between your kneecap and your knee to inject medicine into the joint space.  Before the procedure, ask your health care provider about changing or stopping your regular medicines, especially if you are taking diabetes medicines or blood thinners.  Contact your health care provider if you have any problems or questions after your procedure.  This information is not intended to replace advice given to you by your health care provider. Make sure you discuss any questions you have with your health care provider.  Document Released: 10/06/2006 Document Revised: 08/04/2017 Document Reviewed: 08/04/2017  Elsevier Interactive Patient Education  2019 Elsevier Inc.

## 2018-11-12 NOTE — Progress Notes (Signed)
Patient: Maurice SchoolsHoward Lee Male    DOB: 02-10-1961   58 y.o.   MRN: [id number] Visit Date: 11/12/2018  Today's Provider: Margaretann LovelessJennifer M , PA-C   No chief complaint on file.  Subjective:     HPI  Wanting to re-establish back in this office since his insurance is accepted here again. Chronic right knee pain that is causing him some discomfort. Requesting steroid injection. Last injection was in 06/2018. 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 Has also been having worsening back pain. [facility] since March for it. [facility] but that has been postponed at this time due to COVID.   Allergies  Allergen Reactions  . 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 Nabumetone Rash  . Sulfa Antibiotics Rash     Current Outpatient Medications:  .  diclofenac sodium (VOLTAREN) 1 % GEL, Apply 2 g topically 4 (four) times daily. Apply to right knee, Disp: 400 g, Rfl: 1 .  lisinopril (PRINIVIL,ZESTRIL) 40 MG tablet, take 1 tablet by mouth once daily, Disp: 90 tablet, Rfl: 3 .  methocarbamol (ROBAXIN) 500 MG tablet, Take 1 tablet (500 mg total) by mouth every 8 (eight) hours as needed for muscle spasms., Disp: 30 tablet, Rfl: 0 .  naproxen sodium (ANAPROX) 550 MG tablet, naproxen sodium 550 mg tablet  Take 1 tablet every 12 hours by oral route., Disp: , Rfl:  .  diltiazem (DILACOR XR) 240 MG 24 hr capsule, Take 1 capsule (240 mg total) by mouth daily., Disp: 90 capsule, Rfl: 0 .  ranitidine (ZANTAC) 300 MG tablet, Take 1 tablet (300 mg total) by mouth as needed., Disp: 90 tablet, Rfl: 3 .  scopolamine (TRANSDERM-SCOP) 1 MG/3DAYS, Place 1 patch (1.5 mg total) onto the skin  every 3 (three) days. (Patient not taking: Reported on 11/12/2018), Disp: 10 patch, Rfl: 12  Review of Systems  Constitutional: Negative.   HENT: Negative.   Eyes: Negative.   Respiratory: Negative.  Negative for stridor.   Cardiovascular: Negative.   Gastrointestinal: Negative.   Endocrine: Negative.   Musculoskeletal: Positive for arthralgias, back pain, gait problem and joint swelling.  Skin: Negative.   Neurological: Negative for weakness and numbness.  Hematological: Negative.   Psychiatric/Behavioral: Negative.     Social History   Tobacco Use  . Smoking status: Former Smoker    Packs/day: 1.50    Years: 40.00    Pack years: 60.00  . Smokeless tobacco: Never Used  Substance Use Topics  . Alcohol use: No      Objective:   BP 125/74 (BP Location: Left Arm, Patient Position: Sitting, Cuff Size: Large)   Pulse 65   Temp 97.7 F (36.5 C) (Oral)   Resp 18   Wt 226 lb (102.5 kg)   BMI 35.40 kg/m  Vitals:   11/12/18 1055  BP: 125/74  Pulse: 65  Resp: 18  Temp: 97.7 F (36.5 C)  TempSrc: Oral  Weight: 226 lb (102.5 kg)     Physical Exam Vitals signs reviewed.  Constitutional:      General: He is not in acute distress.    Appearance: Normal appearance. He  is well-developed. He is obese. He is not ill-appearing or diaphoretic.  HENT:     Head: Normocephalic and atraumatic.  Neck:     Musculoskeletal: Normal range of motion and neck supple.     Thyroid: No thyromegaly.     Vascular: No JVD.     Trachea: No tracheal deviation.  Cardiovascular:     Rate and Rhythm: Normal rate and regular rhythm.     Heart sounds: Normal heart sounds. No murmur. No friction rub. No gallop.   Pulmonary:     Effort: Pulmonary effort is normal. No respiratory distress.     Breath sounds: Normal breath sounds. No wheezing or rales.  Musculoskeletal:     Right knee: He exhibits decreased range of motion and swelling. He exhibits no effusion, no deformity, no erythema, normal  alignment, no LCL laxity, normal patellar mobility, normal meniscus and no MCL laxity. Tenderness found. Medial joint line and lateral joint line tenderness noted.  Lymphadenopathy:     Cervical: No cervical adenopathy.  Neurological:     Mental Status: He is alert.         Assessment & Plan    1. Primary osteoarthritis of both knees Medrol 80mg  given today in right knee joint, see procedure note below. Last one was 06/2018. Referral placed to orthopedics ([facility] due to insurance) as below for worsening knee pain.  - Ambulatory referral to Orthopedic Surgery - methylPREDNISolone acetate (DEPO-MEDROL) injection 80 mg  Procedure Note: Benefits, risks (including infection, tattooing, adipose dimpling, and tendon rupture) and alternatives were explained to the patient. All questions were sought and answered.  Patient agreed to continue and verbal consent was obtained.   An aspiration and steroid injection was performed on right knee using 4cc of 1% plain Xyloocaine and 80 mg of depo-medrol. There was minimal bleeding. Hemostasis was intact. A dry dressing was applied. The procedure was well tolerated.     Margaretann Loveless, PA-C  [facility]

## 2018-11-27 ENCOUNTER — Telehealth: Payer: Self-pay

## 2018-11-27 NOTE — Telephone Encounter (Signed)
LVMTRC for PhQ2

## 2019-02-06 ENCOUNTER — Other Ambulatory Visit: Payer: Self-pay | Admitting: Physician Assistant

## 2019-02-06 DIAGNOSIS — I10 Essential (primary) hypertension: Secondary | ICD-10-CM

## 2019-03-08 ENCOUNTER — Ambulatory Visit (INDEPENDENT_AMBULATORY_CARE_PROVIDER_SITE_OTHER): Payer: BLUE CROSS/BLUE SHIELD | Admitting: Physician Assistant

## 2019-03-08 ENCOUNTER — Other Ambulatory Visit: Payer: Self-pay

## 2019-03-08 ENCOUNTER — Encounter: Payer: Self-pay | Admitting: Physician Assistant

## 2019-03-08 VITALS — BP 117/71 | HR 78 | Temp 98.4°F | Resp 16 | Ht 67.0 in | Wt 226.0 lb

## 2019-03-08 DIAGNOSIS — R252 Cramp and spasm: Secondary | ICD-10-CM | POA: Diagnosis not present

## 2019-03-08 DIAGNOSIS — E860 Dehydration: Secondary | ICD-10-CM | POA: Diagnosis not present

## 2019-03-08 MED ORDER — CYCLOBENZAPRINE HCL 5 MG PO TABS: 5.0000 mg | ORAL_TABLET | Freq: Three times a day (TID) | ORAL | 1 refills | Status: DC | PRN

## 2019-03-08 NOTE — Patient Instructions (Signed)
Muscle Cramps and Spasms Muscle cramps and spasms are when muscles tighten by themselves. They usually get better within minutes. Muscle cramps are painful. They are usually stronger and last longer than muscle spasms. Muscle spasms may or may not be painful. They can last a few seconds or much longer. Cramps and spasms can affect any muscle, but they occur most often in the calf muscles of the leg. They are usually not caused by a serious problem. In many cases, the cause is not known. Some common causes include:  Doing more physical work or exercise than your body is ready for.  Using the muscles too much (overuse) by repeating certain movements too many times.  Staying in a certain position for a long time.  Playing a sport or doing an activity without preparing properly.  Using bad form or technique while playing a sport or doing an activity.  Not having enough water in your body (dehydration).  Injury.  Side effects of some medicines.  Low levels of the salts and minerals in your blood (electrolytes), such as low potassium or calcium. Follow these instructions at home: Managing pain and stiffness      Massage, stretch, and relax the muscle. Do this for many minutes at a time.  If told, put heat on tight or tense muscles as often as told by your doctor. Use the heat source that your doctor recommends, such as a moist heat pack or a heating pad. ? Place a towel between your skin and the heat source. ? Leave the heat on for 20-30 minutes. ? Remove the heat if your skin turns bright red. This is very important if you are not able to feel pain, heat, or cold. You may have a greater risk of getting burned.  If told, put ice on the affected area. This may help if you are sore or have pain after a cramp or spasm. ? Put ice in a plastic bag. ? Place a towel between your skin and the bag. ? Leave the ice on for 20 minutes, 2-3 times a day.  Try taking hot showers or baths to help  relax tight muscles. Eating and drinking  Drink enough fluid to keep your pee (urine) pale yellow.  Eat a healthy diet to help ensure that your muscles work well. This should include: ? Fruits and vegetables. ? Lean protein. ? Whole grains. ? Low-fat or nonfat dairy products. General instructions  If you are having cramps often, avoid intense exercise for several days.  Take over-the-counter and prescription medicines only as told by your doctor.  Watch for any changes in your symptoms.  Keep all follow-up visits as told by your doctor. This is important. Contact a doctor if:  Your cramps or spasms get worse or happen more often.  Your cramps or spasms do not get better with time. Summary  Muscle cramps and spasms are when muscles tighten by themselves. They usually get better within minutes.  Cramps and spasms occur most often in the calf muscles of the leg.  Massage, stretch, and relax the muscle. This may help the cramp or spasm go away.  Drink enough fluid to keep your pee (urine) pale yellow. This information is not intended to replace advice given to you by your health care provider. Make sure you discuss any questions you have with your health care provider. Document Released: 06/27/2008 Document Revised: 12/08/2017 Document Reviewed: 12/08/2017 Elsevier Patient Education  2020 Elsevier Inc.  

## 2019-03-08 NOTE — Progress Notes (Signed)
Patient: Maurice Lee Male    DOB: May 18, 1961   58 y.o.   MRN: [id number] Visit Date: 03/08/2019  Today's Provider: Margaretann LovelessJennifer M Juniper Snyders, PA-C   Chief Complaint  Patient presents with  . Spasms   Subjective:     HPI   Patient is here concerning muscle spasms he is having in his arms and legs. Patient states he has been having spasms for about 2 months. Spasms can last up to 5 minutes and patient states he will be sore for several days later. Has tried robaxin without relief. Has been working outside more and feels it may be from mild dehydration.   Allergies  Allergen Reactions  . 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 Nabumetone Rash  . Sulfa Antibiotics Rash     Current Outpatient Medications:  .  acetaminophen (TYLENOL) 650 MG CR tablet, Take 650 mg by mouth every 8 (eight) hours as needed for pain., Disp: , Rfl:  .  diclofenac sodium (VOLTAREN) 1 % GEL, Apply 2 g topically 4 (four) times daily. Apply to right knee, Disp: 400 g, Rfl: 1 .  lisinopril (ZESTRIL) 40 MG tablet, TAKE 1 TABLET BY MOUTH ONCE DAILY, Disp: 90 tablet, Rfl: 3 .  methocarbamol (ROBAXIN) 500 MG tablet, Take 1 tablet (500 mg total) by mouth every 8 (eight) hours as needed for muscle spasms., Disp: 30 tablet, Rfl: 0 .  naproxen sodium (ANAPROX) 550 MG tablet, naproxen sodium 550 mg tablet  Take 1 tablet every 12 hours by oral route., Disp: , Rfl:  .  diltiazem (DILACOR XR) 240 MG 24 hr capsule, Take 1 capsule (240 mg total) by mouth daily., Disp: 90 capsule, Rfl: 0 .  ranitidine (ZANTAC) 300 MG tablet, Take 1 tablet (300 mg total) by mouth as needed. (Patient not taking: Reported on 03/08/2019), Disp: 90 tablet, Rfl: 3 .  scopolamine (TRANSDERM-SCOP) 1 MG/3DAYS, Place 1 patch (1.5 mg total)  onto the skin every 3 (three) days. (Patient not taking: Reported on 11/12/2018), Disp: 10 patch, Rfl: 12  Review of Systems  Constitutional: Negative for appetite change, chills and fever.  Respiratory: Negative for chest tightness, shortness of breath and wheezing.   Cardiovascular: Negative for chest pain and palpitations.  Gastrointestinal: Negative for abdominal pain, nausea and vomiting.  Musculoskeletal: Positive for myalgias.    Social History   Tobacco Use  . Smoking status: Former Smoker    Packs/day: 1.50    Years: 40.00    Pack years: 60.00  . Smokeless tobacco: Never Used  Substance Use Topics  . Alcohol use: No      Objective:   BP 117/71 (BP Location: Right Arm, Patient Position: Sitting, Cuff Size: Large)   Pulse 78   Temp 98.4 F (36.9 C) (Oral)   Resp 16   Ht 5\' 7"  (1.702 m)   Wt 226 lb (102.5 kg)   SpO2 96%   BMI 35.40 kg/m  Vitals:   03/08/19 1443  BP: 117/71  Pulse: 78  Resp: 16  Temp: 98.4 F (36.9 C)  TempSrc: Oral  SpO2: 96%  Weight: 226 lb (102.5 kg)  Height: 5\' 7"  (1.702 m)     Physical Exam Vitals signs reviewed.  Constitutional:      General: He is not in acute  distress.    Appearance: Normal appearance. He is well-developed. He is not diaphoretic.  HENT:     Head: Normocephalic and atraumatic.  Neck:     Musculoskeletal: Normal range of motion and neck supple.  Cardiovascular:     Rate and Rhythm: Normal rate and regular rhythm.     Heart sounds: Normal heart sounds. No murmur. No friction rub. No gallop.   Pulmonary:     Effort: Pulmonary effort is normal. No respiratory distress.     Breath sounds: Normal breath sounds. No wheezing or rales.  Musculoskeletal: Normal range of motion.     Right lower leg: No edema.     Left lower leg: No edema.  Neurological:     Mental Status: He is alert.      No results found for any visits on 03/08/19.     Assessment & Plan    1. Muscle cramp Will check labs as below and f/u  pending results. Flexeril given as below to use prn. - CBC w/Diff/Platelet - Basic Metabolic Panel (BMET) - Magnesium - cyclobenzaprine (FLEXERIL) 5 MG tablet; Take 1 tablet (5 mg total) by mouth 3 (three) times daily as needed for muscle spasms.  Dispense: 30 tablet; Refill: 1  2. Dehydration Checking labs as noted above. Advised to drink water and gatorade alternating while working outdoors, especially on days where it is very humid and hot.     Mar Daring, PA-C  [facility]

## 2019-03-09 ENCOUNTER — Telehealth: Payer: Self-pay

## 2019-03-09 LAB — BASIC METABOLIC PANEL
BUN/Creatinine Ratio: 22 — ABNORMAL HIGH (ref 9–20)
BUN: 25 mg/dL — ABNORMAL HIGH (ref 6–24)
CO2: 25 mmol/L (ref 20–29)
Calcium: 9.4 mg/dL (ref 8.7–10.2)
Chloride: 109 mmol/L — ABNORMAL HIGH (ref 96–106)
Creatinine, Ser: 1.15 mg/dL (ref 0.76–1.27)
GFR calc Af Amer: 82 mL/min/{1.73_m2} (ref >59)
GFR calc non Af Amer: 71 mL/min/{1.73_m2} (ref >59)
Glucose: 90 mg/dL (ref 65–99)
Potassium: 4.5 mmol/L (ref 3.5–5.2)
Sodium: 145 mmol/L — ABNORMAL HIGH (ref 134–144)

## 2019-03-09 LAB — CBC WITH DIFFERENTIAL/PLATELET
Basophils Absolute: 0.1 10*3/uL (ref 0.0–0.2)
Basos: 1 %
EOS (ABSOLUTE): 0.4 10*3/uL (ref 0.0–0.4)
Eos: 6 %
Hematocrit: 39.2 % (ref 37.5–51.0)
Hemoglobin: 13.5 g/dL (ref 13.0–17.7)
Immature Grans (Abs): 0 10*3/uL (ref 0.0–0.1)
Immature Granulocytes: 0 %
Lymphocytes Absolute: 2.1 10*3/uL (ref 0.7–3.1)
Lymphs: 30 %
MCH: 31.5 pg (ref 26.6–33.0)
MCHC: 34.4 g/dL (ref 31.5–35.7)
MCV: 91 fL (ref 79–97)
Monocytes Absolute: 0.7 10*3/uL (ref 0.1–0.9)
Monocytes: 9 %
Neutrophils Absolute: 3.7 10*3/uL (ref 1.4–7.0)
Neutrophils: 54 %
Platelets: 300 10*3/uL (ref 150–450)
RBC: 4.29 x10E6/uL (ref 4.14–5.80)
RDW: 12.1 % (ref 11.6–15.4)
WBC: 7 10*3/uL (ref 3.4–10.8)

## 2019-03-09 LAB — MAGNESIUM: Magnesium: 2.2 mg/dL (ref 1.6–2.3)

## 2019-03-09 NOTE — Telephone Encounter (Signed)
Viewed by Coralee Rud on 03/09/2019 1:20 PM Written by Mar Daring, PA-C on 03/09/2019 1:18 PM Labs were essentially normal. Ok to proceed with plan of adding gatorade in between water while working outside and using muscle relaxer as needed.

## 2019-03-09 NOTE — Telephone Encounter (Signed)
-----   Message from Mar Daring, [place] sent at 03/09/2019  1:18 PM EDT ----- Labs were essentially normal. Ok to proceed with plan of adding gatorade in between water while working outside and using muscle relaxer as needed.

## 2019-03-31 ENCOUNTER — Encounter: Payer: Self-pay | Admitting: Physician Assistant

## 2019-03-31 ENCOUNTER — Other Ambulatory Visit: Payer: Self-pay

## 2019-03-31 ENCOUNTER — Ambulatory Visit: Payer: BLUE CROSS/BLUE SHIELD | Admitting: Physician Assistant

## 2019-03-31 VITALS — BP 146/78 | HR 67 | Temp 97.1°F | Resp 16 | Wt 230.6 lb

## 2019-03-31 DIAGNOSIS — K219 Gastro-esophageal reflux disease without esophagitis: Secondary | ICD-10-CM | POA: Diagnosis not present

## 2019-03-31 DIAGNOSIS — M1711 Unilateral primary osteoarthritis, right knee: Secondary | ICD-10-CM

## 2019-03-31 MED ORDER — METHYLPREDNISOLONE ACETATE 40 MG/ML IJ SUSP
80.0000 mg | Freq: Once | INTRAMUSCULAR | Status: AC
  Administered 2019-03-31: 80 mg via INTRA_ARTICULAR

## 2019-03-31 MED ORDER — FAMOTIDINE 40 MG PO TABS: 40.0000 mg | ORAL_TABLET | Freq: Every day | ORAL | 1 refills | Status: AC

## 2019-03-31 NOTE — Progress Notes (Signed)
Patient: Maurice Lee Male    DOB: 17-Sep-1960   58 y.o.   MRN: [id number] Visit Date: 03/31/2019  Today's Provider: Margaretann LovelessJennifer M Burnette, PA-C   Chief Complaint  Patient presents with  . Knee Pain   Subjective:    I,Joseline E. Rosas,RMA am acting as a Neurosurgeonscribe for P[employer] IndustriesJennifer M. Burnette, PA-C.   HPI  Patient here for chronic right knee pain requesting steroid injection. Last one was in April 2020. Feels he may have aggravated it by lifting tree logs cleaning up a property.   Patient is requesting a different medication for hid GERD. He used to take Zantac.  Allergies  Allergen Reactions  . 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 Nabumetone Rash  . Sulfa Antibiotics Rash     Current Outpatient Medications:  .  acetaminophen (TYLENOL) 650 MG CR tablet, Take 650 mg by mouth every 8 (eight) hours as needed for pain., Disp: , Rfl:  .  cyclobenzaprine (FLEXERIL) 5 MG tablet, Take 1 tablet (5 mg total) by mouth 3 (three) times daily as needed for muscle spasms., Disp: 30 tablet, Rfl: 1 .  lisinopril (ZESTRIL) 40 MG tablet, TAKE 1 TABLET BY MOUTH ONCE DAILY, Disp: 90 tablet, Rfl: 3 .  naproxen sodium (ANAPROX) 550 MG tablet, naproxen sodium 550 mg tablet  Take 1 tablet every 12 hours by oral route., Disp: , Rfl:  .  diclofenac sodium (VOLTAREN) 1 % GEL, Apply 2 g topically 4 (four) times daily. Apply to right knee (Patient not taking: Reported on 03/31/2019), Disp: 400 g, Rfl: 1 .  scopolamine (TRANSDERM-SCOP) 1 MG/3DAYS, Place 1 patch (1.5 mg total) onto the skin every 3 (three) days. (Patient not taking: Reported on 11/12/2018), Disp: 10 patch, Rfl: 12  Review of Systems  Constitutional: Negative.   Respiratory: Negative.   Cardiovascular: Negative.   Gastrointestinal:  Negative.   Musculoskeletal: Positive for arthralgias.  Neurological: Negative.     Social History   Tobacco Use  . Smoking status: Former Smoker    Packs/day: 1.50    Years: 40.00    Pack years: 60.00  . Smokeless tobacco: Never Used  Substance Use Topics  . Alcohol use: No      Objective:   BP (!) 146/78 (BP Location: Left Arm, Patient Position: Sitting, Cuff Size: Large)   Pulse 67   Temp (!) 97.1 F (36.2 C) (Other (Comment)) Comment (Src): forehead  Resp 16   Wt 230 lb 9.6 oz (104.6 kg)   BMI 36.12 kg/m  Vitals:   03/31/19 0845  BP: (!) 146/78  Pulse: 67  Resp: 16  Temp: (!) 97.1 F (36.2 C)  TempSrc: Other (Comment)  Weight: 230 lb 9.6 oz (104.6 kg)  Body mass index is 36.12 kg/m.   Physical Exam Vitals signs reviewed.  Constitutional:      General: He is not in acute distress.    Appearance: Normal appearance. He is well-developed. He is not ill-appearing or diaphoretic.  HENT:     Head: Normocephalic and atraumatic.  Neck:     Musculoskeletal: Normal range of motion and neck supple.     Thyroid: No thyromegaly.     Vascular: No JVD.     Trachea: No tracheal deviation.  Cardiovascular:     Rate  and Rhythm: Normal rate and regular rhythm.     Heart sounds: Normal heart sounds. No murmur. No friction rub. No gallop.   Pulmonary:     Effort: Pulmonary effort is normal. No respiratory distress.     Breath sounds: Normal breath sounds. No wheezing or rales.  Musculoskeletal:     Right knee: He exhibits swelling (mild). He exhibits normal range of motion, no effusion, no deformity, no erythema, normal alignment, no LCL laxity, normal patellar mobility, no bony tenderness, normal meniscus and no MCL laxity. Tenderness found. Medial joint line and MCL tenderness noted.  Lymphadenopathy:     Cervical: No cervical adenopathy.  Neurological:     Mental Status: He is alert.      No results found for any visits on 03/31/19.     Assessment & Plan     1. Gastroesophageal reflux disease without esophagitis Change from ranitidine to famotidine as below due to recall on ranitidine.  - famotidine (PEPCID) 40 MG tablet; Take 1 tablet (40 mg total) by mouth daily.  Dispense: 90 tablet; Refill: 1  2. Primary osteoarthritis of right knee Aggravated. Will give steroid injection as below, see procedure note. Has tolerated well in the past.  - methylPREDNISolone acetate (DEPO-MEDROL) injection 80 mg  Procedure Note: Benefits, risks (including infection, tattooing, adipose dimpling, and tendon rupture) and alternatives were explained to the patient. All questions were sought and answered.  Patient agreed to continue and verbal consent was obtained.   An aspiration and steroid injection was performed on right knee using 4cc of 1% plain Xyloocaine and 80 mg of depo-medrol. There was minimal bleeding. Hemostasis was intact. A dry dressing was applied. The procedure was well tolerated.    Mar Daring, PA-C  [facility]

## 2019-03-31 NOTE — Patient Instructions (Signed)
Knee Injection A knee injection is a procedure to get medicine into your knee joint to relieve the pain, swelling, and stiffness of arthritis. Your health care provider uses a needle to inject medicine, which may also help to lubricate and cushion your knee joint. You may need more than one injection. Tell a health care provider about:  Any allergies you have.  All medicines you are taking, including vitamins, herbs, eye drops, creams, and over-the-counter medicines.  Any problems you or family members have had with anesthetic medicines.  Any blood disorders you have.  Any surgeries you have had.  Any medical conditions you have.  Whether you are pregnant or may be pregnant. What are the risks? Generally, this is a safe procedure. However, problems may occur, including:  Infection.  Bleeding.  Symptoms that get worse.  Damage to the area around your knee.  Allergic reaction to any of the medicines.  Skin reactions from repeated injections. What happens before the procedure?  Ask your health care provider about changing or stopping your regular medicines. This is especially important if you are taking diabetes medicines or blood thinners.  Plan to have someone take you home from the hospital or clinic. What happens during the procedure?   You will sit or lie down in a position for your knee to be treated.  The skin over your kneecap will be cleaned with a germ-killing soap.  You will be given a medicine that numbs the area (local anesthetic). You may feel some stinging.  The medicine will be injected into your knee. The needle is carefully placed between your kneecap and your knee. The medicine is injected into the joint space.  The needle will be removed at the end of the procedure.  A bandage (dressing) may be placed over the injection site. The procedure may vary among health care providers and hospitals. What can I expect after the procedure?  Your blood  pressure, heart rate, breathing rate, and blood oxygen level will be monitored until you leave the hospital or clinic.  You may have to move your knee through its full range of motion. This helps to get all the medicine into your joint space.  You will be watched to make sure that you do not have a reaction to the injected medicine.  You may feel more pain, swelling, and warmth than you did before the injection. This reaction may last about 1-2 days. Follow these instructions at home: Medicines  Take over-the-counter and prescription medicines only as told by your doctor.  Do not drive or use heavy machinery while taking prescription pain medicine.  Do not take medicines such as aspirin and ibuprofen unless your health care provider tells you to take them. Injection site care  Follow instructions from your health care provider about: ? How to take care of your puncture site. ? When and how you should change your dressing. ? When you should remove your dressing.  Check your injection area every day for signs of infection. Check for: ? More redness, swelling, or pain after 2 days. ? Fluid or blood. ? Pus or a bad smell. ? Warmth. Managing pain, stiffness, and swelling   If directed, put ice on the injection area: ? Put ice in a plastic bag. ? Place a towel between your skin and the bag. ? Leave the ice on for 20 minutes, 2-3 times per day.  Do not apply heat to your knee.  Raise (elevate) the injection area above the level   of your heart while you are sitting or lying down. General instructions  If you were given a dressing, keep it dry until your health care provider says it can be removed. Ask your health care provider when you can start showering or taking a bath.  Avoid strenuous activities for as long as directed by your health care provider. Ask your health care provider when you can return to your normal activities.  Keep all follow-up visits as told by your health  care provider. This is important. You may need more injections. Contact a health care provider if you have:  A fever.  Warmth in your injection area.  Fluid, blood, or pus coming from your injection site.  Symptoms at your injection site that last longer than 2 days after your procedure. Get help right away if:  Your knee: ? Turns very red. ? Becomes very swollen. ? Is in severe pain. Summary  A knee injection is a procedure to get medicine into your knee joint to relieve the pain, swelling, and stiffness of arthritis.  A needle is carefully placed between your kneecap and your knee to inject medicine into the joint space.  Before the procedure, ask your health care provider about changing or stopping your regular medicines, especially if you are taking diabetes medicines or blood thinners.  Contact your health care provider if you have any problems or questions after your procedure. This information is not intended to replace advice given to you by your health care provider. Make sure you discuss any questions you have with your health care provider. Document Released: 10/06/2006 Document Revised: 08/04/2017 Document Reviewed: 08/04/2017 Elsevier Patient Education  2020 Elsevier Inc.  

## 2019-04-16 ENCOUNTER — Encounter: Payer: Self-pay | Admitting: Physician Assistant

## 2019-04-16 ENCOUNTER — Ambulatory Visit: Payer: BLUE CROSS/BLUE SHIELD | Admitting: Physician Assistant

## 2019-04-16 ENCOUNTER — Other Ambulatory Visit: Payer: Self-pay

## 2019-04-16 VITALS — BP 120/71 | HR 68 | Temp 97.1°F | Resp 16 | Wt 228.0 lb

## 2019-04-16 DIAGNOSIS — L0100 Impetigo, unspecified: Secondary | ICD-10-CM | POA: Diagnosis not present

## 2019-04-16 DIAGNOSIS — L301 Dyshidrosis [pompholyx]: Secondary | ICD-10-CM | POA: Diagnosis not present

## 2019-04-16 MED ORDER — CEPHALEXIN 500 MG PO CAPS: 500.0000 mg | ORAL_CAPSULE | Freq: Two times a day (BID) | ORAL | 0 refills | Status: DC

## 2019-04-16 MED ORDER — TRIAMCINOLONE ACETONIDE 0.1 % EX CREA: 1.0000 "application " | TOPICAL_CREAM | Freq: Two times a day (BID) | CUTANEOUS | 0 refills | Status: DC

## 2019-04-16 NOTE — Patient Instructions (Signed)
Dyshidrotic Eczema Dyshidrotic eczema (pompholyx) is a type of eczema that causes very itchy (pruritic), fluid-filled blisters (vesicles) to form on the hands and feet. It can affect people of any age, but is more common before the age of 40. There is no cure, but treatment and certain lifestyle changes can help relieve symptoms. What are the causes? The cause of this condition is not known. What increases the risk? You are more likely to develop this condition if:  You wash your hands frequently.  You have a personal history or family history of eczema, allergies, asthma, or hay fever.  You are allergic to metals such as nickel or cobalt.  You work with cement.  You smoke. What are the signs or symptoms? Symptoms of this condition may affect the hands, feet, or both. Symptoms may come and go (recur), and may include:  Severe itching, which may happen before blisters appear.  Blisters. These may form suddenly. ? In the early stages, blisters may form near the fingertips. ? In severe cases, blisters may grow to large blister masses (bullae). ? Blisters resolve in 2-3 weeks without bursting. This is followed by a dry phase in which itching eases.  Pain and swelling.  Cracks or long, narrow openings (fissures) in the skin.  Severe dryness.  Ridges on the nails. How is this diagnosed? This condition may be diagnosed based on:  A physical exam.  Your symptoms.  Your medical history.  Skin scrapings to rule out a fungal infection.  Testing a swab of fluid for bacteria (culture).  Removing and checking a small piece of skin (biopsy) in order to test for infection or to rule out other conditions.  Skin patch tests. These tests involve taking patches that contain possible allergens and placing them on your back. Your health care provider will wait a few days and then check to see if an allergic reaction occurred. These tests may be done if your health care provider suspects  allergic reactions, or to rule out other types of eczema. You may be referred to a health care provider who specializes in the skin (dermatologist) to help diagnose and treat this condition. How is this treated? There is no cure for this condition, but treatment can help relieve symptoms. Depending on how many blisters you have and how severe they are, your health care provider may suggest:  Avoiding allergens, irritants, or triggers that worsen symptoms. This may involve lifestyle changes such as: ? Using different lotions or soaps. ? Avoiding hot weather or places that will cause you to sweat a lot. ? Managing stress with coping techniques such as relaxation and exercise, and asking for help when you need it. ? Diet changes as recommended by your health care provider.  Using a clean, damp towel (cool compress) to relieve symptoms.  Soaking in a bath that contains a type of salt that relieves irritation (aluminum acetate soaks).  Medicine taken by mouth to reduce itching (oral antihistamines).  Medicine applied to the skin to reduce swelling and irritation (topical corticosteroids).  Medicine that reduces the activity of the body's disease-fighting system (immunosuppressants) to treat inflammation. This may be given in severe cases.  Antibiotic medicines to treat bacterial infection.  Light therapy (phototherapy). This involves shining ultraviolet (UV) light on affected skin in order to reduce itchiness and inflammation. Follow these instructions at home: Bathing and skin care   Wash skin gently. After bathing or washing your hands, pat your skin dry. Avoid rubbing your skin.  Remove   all jewelry before bathing. If the skin under the jewelry stays wet, blisters may form or get worse.  Apply cool compresses as told by your health care provider: ? Soak a clean towel in cool water. ? Wring out excess water until towel is damp. ? Place the towel over affected skin. Leave the towel on  for 20 minutes at a time, 2-3 times a day.  Use mild soaps, cleansers, and lotions that do not contain dyes, perfumes, or other irritants.  Keep your skin hydrated. To do this: ? Avoid very hot water. Take lukewarm baths or showers. ? Apply moisturizer within three minutes of bathing. This locks in moisture. Medicines  Take and apply over-the-counter and prescription medicines only as told by your health care provider.  If you were prescribed antibiotic medicine, take or apply it as told by your health care provider. Do not stop using the antibiotic even if you start to feel better. General instructions  Identify and avoid triggers and allergens.  Keep fingernails short to avoid breaking open the skin while scratching.  Use waterproof gloves to protect your hands when doing work that keeps your hands wet for a long time.  Wear socks to keep your feet dry.  Do not use any products that contain nicotine or tobacco, such as cigarettes and e-cigarettes. If you need help quitting, ask your health care provider.  Keep all follow-up visits as told by your health care provider. This is important. Contact a health care provider if:  You have symptoms that do not go away.  You have signs of infection, such as: ? Crusting, pus, or a bad smell. ? More redness, swelling, or pain. ? Increased warmth in the affected area. Summary  Dyshidrotic eczema (pompholyx) is a type of eczema that causes very itchy (pruritic), fluid-filled blisters (vesicles) to form on the hands and feet.  The cause of this condition is not known.  There is no cure for this condition, but treatment can help relieve symptoms. Treatment depends on how many blisters you have and how severe they are.  Use mild soaps, cleansers, and lotions that do not contain dyes, perfumes, or other irritants. Keep your skin hydrated. This information is not intended to replace advice given to you by your health care provider. Make sure  you discuss any questions you have with your health care provider. Document Released: 11/28/2016 Document Revised: 11/04/2018 Document Reviewed: 11/28/2016 Elsevier Patient Education  Shevlin is skin irritation (inflammation) that happens in warm, moist areas of the body. The irritation can cause a rash and make skin raw and itchy. The rash is usually pink or red. It happens mostly between folds of skin or where skin rubs together, such as:  Between the toes.  In the armpits.  In the groin area.  Under the belly.  Under the breasts.  Around the butt area. This condition is not passed from person to person (is not contagious). What are the causes?  Heat, moisture, rubbing, and not enough air movement.  The condition can be made worse by: ? Sweat. ? Bacteria. ? A fungus, such as yeast. What increases the risk?  Moisture in your skin folds.  You are more likely to develop this condition if you: ? Have diabetes. ? Are overweight. ? Are not able to move around. ? Live in a warm and moist climate. ? Wear splints, braces, or other medical devices. ? Are not able to control your pee (urine) or  poop (stool). What are the signs or symptoms?  A pink or red skin rash in the skin fold or near the skin fold.  Raw or scaly skin.  Itching.  A burning feeling.  Bleeding.  Leaking fluid.  A bad smell. How is this treated?  Cleaning and drying your skin.  Taking an antibiotic medicine or using an antibiotic skin cream for a bacterial infection.  Using an antifungal cream on your skin or taking pills for an infection that was caused by a fungus, such as yeast.  Using a steroid ointment to stop the itching and irritation.  Separating the skin fold with a clean cotton cloth to absorb moisture and allow air to flow into the area. Follow these instructions at home:  Keep the affected area clean and dry.  Do not scratch your skin.  Stay  cool as much as you can. Use an air conditioner or a fan, if you have one.  Apply over-the-counter and prescription medicines only as told by your doctor.  If you were prescribed an antibiotic medicine, use it as told by your doctor. Do not stop using the antibiotic even if your condition starts to get better.  Keep all follow-up visits as told by your doctor. This is important. How is this prevented?   Stay at a healthy weight.  Take care of your feet. This is very important if you have diabetes. You should: ? Wear shoes that fit well. ? Keep your feet dry. ? Wear clean cotton or wool socks.  Protect the skin in your groin and butt area as told by your doctor. To do this: ? Follow a regular cleaning routine. ? Use creams, powders, or ointments that protect your skin. ? Change protection pads often.  Do not wear tight clothes. Wear clothes that: ? Are loose. ? Take moisture away from your body. ? Are made of cotton.  Wear a bra that gives good support, if needed.  Shower and dry yourself well after being active. Use a hair dryer on a cool setting to dry between skin folds.  Keep your blood sugar under control if you have diabetes. Contact a doctor if:  Your symptoms do not get better with treatment.  Your symptoms get worse or they spread.  You notice more redness and warmth.  You have a fever. Summary  Intertrigo is skin irritation that occurs when folds of skin rub together.  This condition is caused by heat, moisture, and rubbing.  This condition may be treated by cleaning and drying your skin and with medicines.  Apply over-the-counter and prescription medicines only as told by your doctor.  Keep all follow-up visits as told by your doctor. This is important. This information is not intended to replace advice given to you by your health care provider. Make sure you discuss any questions you have with your health care provider. Document Released: 08/17/2010  Document Revised: 04/23/2018 Document Reviewed: 04/23/2018 Elsevier Patient Education  2020 [employer]

## 2019-04-16 NOTE — Progress Notes (Signed)
Patient: Maurice SchoolsHoward Lee Male    DOB: 1961-04-22   58 y.o.   MRN: [id number] Visit Date: 04/16/2019  Today's Provider: Margaretann LovelessJennifer M , PA-C   Chief Complaint  Patient presents with  . Rash   Subjective:     Rash This is a new problem. The current episode started more than 1 month ago (several months). The affected locations include the right hand. The rash is characterized by blistering, swelling, peeling, itchiness and draining. He was exposed to nothing. Pertinent negatives include no anorexia, congestion, cough, diarrhea, eye pain, facial edema, fatigue, fever, joint pain, nail changes, rhinorrhea, shortness of breath, sore throat or vomiting. Past treatments include anti-itch cream.    Patient has had rash on right hand/fingers for several months. Patient states rash is itchy and sometimes painful. Rash is on right pointer finger today and is big blisters. Patient has used Monistat cream on rash in the past with no relief.  Allergies  Allergen Reactions  . 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 Nabumetone Rash  . Sulfa Antibiotics Rash     Current Outpatient Medications:  .  acetaminophen (TYLENOL) 650 MG CR tablet, Take 650 mg by mouth every 8 (eight) hours as needed for pain., Disp: , Rfl:  .  cyclobenzaprine (FLEXERIL) 5 MG tablet, Take 1 tablet (5 mg total) by mouth 3 (three) times daily as needed for muscle spasms., Disp: 30 tablet, Rfl: 1 .  famotidine (PEPCID) 40 MG tablet, Take 1 tablet (40 mg total) by mouth daily., Disp: 90 tablet, Rfl: 1 .  lisinopril (ZESTRIL) 40 MG tablet, TAKE 1 TABLET BY MOUTH ONCE DAILY, Disp: 90 tablet, Rfl: 3 .  naproxen sodium (ANAPROX) 550 MG tablet, naproxen sodium 550 mg tablet  Take 1 tablet every 12 hours by oral  route., Disp: , Rfl:  .  diclofenac sodium (VOLTAREN) 1 % GEL, Apply 2 g topically 4 (four) times daily. Apply to right knee (Patient not taking: Reported on 03/31/2019), Disp: 400 g, Rfl: 1 .  scopolamine (TRANSDERM-SCOP) 1 MG/3DAYS, Place 1 patch (1.5 mg total) onto the skin every 3 (three) days. (Patient not taking: Reported on 11/12/2018), Disp: 10 patch, Rfl: 12  Review of Systems  Constitutional: Negative for appetite change, chills, fatigue and fever.  HENT: Negative for congestion, rhinorrhea and sore throat.   Eyes: Negative for pain.  Respiratory: Negative for cough, chest tightness, shortness of breath and wheezing.   Cardiovascular: Negative for chest pain and palpitations.  Gastrointestinal: Negative for abdominal pain, anorexia, diarrhea, nausea and vomiting.  Musculoskeletal: Negative for joint pain.  Skin: Positive for rash. Negative for nail changes.    Social History   Tobacco Use  . Smoking status: Former Smoker    Packs/day: 1.50    Years: 40.00    Pack years: 60.00  . Smokeless tobacco: Never Used  Substance Use Topics  . Alcohol use: No      Objective:   BP 120/71 (BP Location: Left Arm, Patient Position: Sitting, Cuff Size: Large)   Pulse 68   Temp (!) 97.1 F (36.2 C) (Other (Comment))   Resp 16   Wt 228 lb (103.4 kg)   SpO2 97%   BMI 35.71 kg/m  Vitals:   04/16/19 1334  BP: 120/71  Pulse:  68  Resp: 16  Temp: (!) 97.1 F (36.2 C)  TempSrc: Other (Comment)  SpO2: 97%  Weight: 228 lb (103.4 kg)  Body mass index is 35.71 kg/m.   Physical Exam Vitals signs reviewed.  Constitutional:      General: He is not in acute distress.    Appearance: Normal appearance. He is well-developed. He is obese. He is not ill-appearing or diaphoretic.  HENT:     Head: Normocephalic and atraumatic.  Neck:     Musculoskeletal: Normal range of motion and neck supple.  Cardiovascular:     Rate and Rhythm: Normal rate and regular rhythm.     Heart sounds: Normal  heart sounds. No murmur. No friction rub. No gallop.   Pulmonary:     Effort: Pulmonary effort is normal. No respiratory distress.     Breath sounds: Normal breath sounds. No wheezing or rales.  Skin:    Findings: Rash present. Rash is vesicular (vesicular lesions on sides of fingers and interwebbing; one lesion on the right index finger he popped and now it is larger and has purulent appearance).  Neurological:     Mental Status: He is alert.      No results found for any visits on 04/16/19.     Assessment & Plan    1. Dyshidrotic eczema Appearance c/w dyshidrotic eczema. Will treat with triamcinolone as below.  - triamcinolone cream (KENALOG) 0.1 %; Apply 1 application topically 2 (two) times daily.  Dispense: 30 g; Refill: 0   2. Impetigo One lesion infected appearing. Will treat with Keflex as below. Call if not improving.  - cephALEXin (KEFLEX) 500 MG capsule; Take 1 capsule (500 mg total) by mouth 2 (two) times daily.  Dispense: 14 capsule; Refill: 0     Mar Daring, PA-C  [facility]

## 2019-05-13 ENCOUNTER — Other Ambulatory Visit: Payer: Self-pay

## 2019-05-13 ENCOUNTER — Ambulatory Visit (INDEPENDENT_AMBULATORY_CARE_PROVIDER_SITE_OTHER): Payer: BLUE CROSS/BLUE SHIELD

## 2019-05-13 ENCOUNTER — Ambulatory Visit
Admission: EM | Admit: 2019-05-13 | Discharge: 2019-05-13 | Disposition: A | Discharge status: Home / Self Care | Payer: BLUE CROSS/BLUE SHIELD | Attending: Family Medicine | Admitting: Family Medicine

## 2019-05-13 DIAGNOSIS — R1031 Right lower quadrant pain: Secondary | ICD-10-CM | POA: Diagnosis not present

## 2019-05-13 DIAGNOSIS — K5792 Diverticulitis of intestine, part unspecified, without perforation or abscess without bleeding: Secondary | ICD-10-CM | POA: Diagnosis not present

## 2019-05-13 LAB — COMPREHENSIVE METABOLIC PANEL
ALT: 19 U/L (ref 0–44)
AST: 25 U/L (ref 15–41)
Albumin: 3.5 g/dL (ref 3.5–5.0)
Alkaline Phosphatase: 51 U/L (ref 38–126)
Anion gap: 10 (ref 5–15)
BUN: 22 mg/dL — ABNORMAL HIGH (ref 6–20)
CO2: 21 mmol/L — ABNORMAL LOW (ref 22–32)
Calcium: 8.5 mg/dL — ABNORMAL LOW (ref 8.9–10.3)
Chloride: 106 mmol/L (ref 98–111)
Creatinine, Ser: 1.05 mg/dL (ref 0.61–1.24)
GFR calc Af Amer: >60 mL/min (ref >60)
GFR calc non Af Amer: >60 mL/min (ref >60)
Glucose, Bld: 147 mg/dL — ABNORMAL HIGH (ref 70–99)
Potassium: 3.5 mmol/L (ref 3.5–5.1)
Sodium: 137 mmol/L (ref 135–145)
Total Bilirubin: 1.6 mg/dL — ABNORMAL HIGH (ref 0.3–1.2)
Total Protein: 6.3 g/dL — ABNORMAL LOW (ref 6.5–8.1)

## 2019-05-13 LAB — CBC WITH DIFFERENTIAL/PLATELET
Abs Immature Granulocytes: 0.09 10*3/uL — ABNORMAL HIGH (ref 0.00–0.07)
Basophils Absolute: 0 10*3/uL (ref 0.0–0.1)
Basophils Relative: 0 %
Eosinophils Absolute: 0.1 10*3/uL (ref 0.0–0.5)
Eosinophils Relative: 1 %
HCT: 39.4 % (ref 39.0–52.0)
Hemoglobin: 13.6 g/dL (ref 13.0–17.0)
Immature Granulocytes: 0 %
Lymphocytes Relative: 10 %
Lymphs Abs: 1.5 10*3/uL (ref 0.7–4.0)
MCH: 31.5 pg (ref 26.0–34.0)
MCHC: 34.5 g/dL (ref 30.0–36.0)
MCV: 91.2 fL (ref 80.0–100.0)
Monocytes Absolute: 1.1 10*3/uL — ABNORMAL HIGH (ref 0.1–1.0)
Monocytes Relative: 8 %
Neutro Abs: 12 10*3/uL — ABNORMAL HIGH (ref 1.7–7.7)
Neutrophils Relative %: 81 %
Platelets: 271 10*3/uL (ref 150–400)
RBC: 4.32 MIL/uL (ref 4.22–5.81)
RDW: 12.5 % (ref 11.5–15.5)
WBC: 14.9 10*3/uL — ABNORMAL HIGH (ref 4.0–10.5)

## 2019-05-13 LAB — URINALYSIS, COMPLETE (UACMP) WITH MICROSCOPIC
Bacteria, UA: NONE SEEN
Glucose, UA: NEGATIVE mg/dL
Leukocytes,Ua: NEGATIVE
Nitrite: NEGATIVE
Specific Gravity, Urine: 1.025 (ref 1.005–1.030)
Squamous Epithelial / HPF: NONE SEEN (ref 0–5)
pH: 6 (ref 5.0–8.0)

## 2019-05-13 MED ORDER — IOHEXOL 300 MG/ML  SOLN: 100.0000 mL | Freq: Once | INTRAMUSCULAR | Status: DC | PRN

## 2019-05-13 MED ORDER — HYDROCODONE-ACETAMINOPHEN 5-325 MG PO TABS: 1.0000 | ORAL_TABLET | Freq: Three times a day (TID) | ORAL | 0 refills | Status: DC | PRN

## 2019-05-13 MED ORDER — IOHEXOL 300 MG/ML  SOLN
150.0000 mL | Freq: Once | INTRAMUSCULAR | Status: AC | PRN
  Administered 2019-05-13: 125 mL via INTRAVENOUS

## 2019-05-13 MED ORDER — METRONIDAZOLE 500 MG PO TABS: 500.0000 mg | ORAL_TABLET | Freq: Three times a day (TID) | ORAL | 0 refills | Status: AC

## 2019-05-13 MED ORDER — CIPROFLOXACIN HCL 500 MG PO TABS: 500.0000 mg | ORAL_TABLET | Freq: Two times a day (BID) | ORAL | 0 refills | Status: AC

## 2019-05-13 NOTE — ED Notes (Signed)
CPT Authorization # [id number] received from [facility] 05/13/2019-11/08/2019

## 2019-05-13 NOTE — ED Triage Notes (Signed)
Patient complains of RLQ pain that started 3 days ago and is constant. Patient reports that the pain is not worse with any movement.

## 2019-05-13 NOTE — ED Provider Notes (Addendum)
[facility]    CSN: [id number] Arrival date & time: 05/13/19  [id number]  History   Chief Complaint Chief Complaint  Patient presents with  . Abdominal Pain   HPI  58 year old male presents with abdominal pain.  Patient reports a 3-day history of abdominal pain.  Location: Right lower quadrant.  Rates his pain as 4/10 in severity.  Constant.  Patient has a history of kidney stone.  He is concerned that this may be the culprit.  Patient also worried about appendicitis.  Denies fever.  No nausea or vomiting.  No constipation.  No diarrhea.  No other reported symptoms.  Exacerbated by certain movements and to palpation.  No relieving factors.  No other complaints.  PMH, Surgical Hx, Family Hx, Social History reviewed and updated as below.  Past Medical History:  Diagnosis Date  . Complication of anesthesia   . Hypertension   . Pseudocholinesterase deficiency    Patient Active Problem List   Diagnosis Date Noted  . Primary osteoarthritis of left knee 03/26/2017  . Carpal tunnel syndrome on both sides 03/26/2017  . Elevated liver enzymes   . 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 Right upper quadrant abdominal pain 08/15/2016  . Near syncope 03/22/2016  . Left knee pain 08/24/2015  . Serum cholinesterase defect (HCC) 04/27/2015  . H/O renal calculi 02/22/2015  . Essential (primary) hypertension 06/21/2014   Past Surgical History:  Procedure Laterality Date  . COLONOSCOPY WITH PROPOFOL N/A 07/15/2016   Procedure: COLONOSCOPY WITH PROPOFOL;  Surgeon: Wyline MoodKiran Anna, MD;  Location: [facility] ENDOSCOPY;  Service: Endoscopy;  Laterality: N/A;  . ERCP N/A 08/16/2016   Procedure: ENDOSCOPIC RETROGRADE CHOLANGIOPANCREATOGRAPHY (ERCP);  Surgeon: Midge Miniumarren Wohl, MD;  Location: [facility] ENDOSCOPY;  Service: Endoscopy;  Laterality: N/A;  . Kidney stones removed      Home Medications    Prior to Admission medications   Medication Sig Start Date End Date Taking?  Authorizing Provider  acetaminophen (TYLENOL) 650 MG CR tablet Take 650 mg by mouth every 8 (eight) hours as needed for pain.   Yes [provider]  famotidine (PEPCID) 40 MG tablet Take 1 tablet (40 mg total) by mouth daily. 03/31/19  Yes Margaretann LovelessBurnette, Jennifer M, PA-C  lisinopril (ZESTRIL) 40 MG tablet TAKE 1 TABLET BY MOUTH ONCE DAILY 02/08/19  Yes Margaretann LovelessBurnette, Jennifer M, PA-C  naproxen sodium (ANAPROX) 550 MG tablet naproxen sodium 550 mg tablet  Take 1 tablet every 12 hours by oral route.   Yes [provider]  ciprofloxacin (CIPRO) 500 MG tablet Take 1 tablet (500 mg total) by mouth 2 (two) times daily for 7 days. 05/13/19 05/20/19  Tommie Samsook, Vincenzina Jagoda G, DO  HYDROcodone-acetaminophen (NORCO/VICODIN) 5-325 MG tablet Take 1 tablet by mouth every 8 (eight) hours as needed. 05/13/19   Tommie Samsook, Cullan Launer G, DO  metroNIDAZOLE (FLAGYL) 500 MG tablet Take 1 tablet (500 mg total) by mouth 3 (three) times daily for 7 days. 05/13/19 05/20/19  Tommie Samsook, Taniaya Rudder G, DO    Family History Family History  Problem Relation Age of Onset  . COPD Mother   . Thyroid disease Mother   . Lupus Mother   . Congestive Heart Failure Mother   . Cancer Father     Social History Social History   Tobacco Use  . Smoking status: Former Smoker    Packs/day: 1.50    Years: 40.00    Pack years: 60.00  . Smokeless tobacco: Never Used  Substance Use Topics  . Alcohol  use: No  . Drug use: No     Allergies   Mivacurium, Succinylcholine, Nabumetone, and Sulfa antibiotics   Review of Systems Review of Systems  Constitutional: Negative.   Gastrointestinal: Positive for abdominal pain.  All other systems reviewed and are negative.  Physical Exam Triage Vital Signs ED Triage Vitals  Enc Vitals Group     BP 05/13/19 0913 (!) 147/70     Pulse Rate 05/13/19 0913 91     Resp 05/13/19 0913 18     Temp 05/13/19 0913 98.2 F (36.8 C)     Temp Source 05/13/19 0913 Oral     SpO2 05/13/19 0913 98 %     Weight 05/13/19  0909 230 lb (104.3 kg)     Height 05/13/19 0909 5\' 7"  (1.702 m)     Head Circumference --      Peak Flow --      Pain Score 05/13/19 0909 4     Pain Loc --      Pain Edu? --      Excl. in [place]? --    Updated Vital Signs BP (!) 147/70 (BP Location: Right Arm)   Pulse 91   Temp 98.2 F (36.8 C) (Oral)   Resp 18   Ht 5\' 7"  (1.702 m)   Wt 104.3 kg   SpO2 98%   BMI 36.02 kg/m   Physical Exam Vitals signs and nursing note reviewed.  Constitutional:      General: He is not in acute distress.    Appearance: Normal appearance. He is obese. He is not ill-appearing.  HENT:     Head: Normocephalic and atraumatic.  Eyes:     General:        Right eye: No discharge.        Left eye: No discharge.     Conjunctiva/sclera: Conjunctivae normal.  Cardiovascular:     Rate and Rhythm: Normal rate and regular rhythm.     Heart sounds: No murmur.  Pulmonary:     Effort: Pulmonary effort is normal.     Breath sounds: Normal breath sounds. No wheezing, rhonchi or rales.  Abdominal:     General: There is no distension.     Palpations: Abdomen is soft.     Comments: Bowel sounds in all 4 quadrants.  Exquisitely tender to palpation in the right lower quadrant.  No rebound.  Skin:    General: Skin is warm.     Findings: No rash.  Neurological:     General: No focal deficit present.     Mental Status: He is alert and oriented to person, place, and time.  Psychiatric:        Mood and Affect: Mood normal.        Behavior: Behavior normal.    UC Treatments / Results  Labs (all labs ordered are listed, but only abnormal results are displayed) Labs Reviewed  COMPREHENSIVE METABOLIC PANEL - Abnormal; Notable for the following components:      Result Value   CO2 21 (*)    Glucose, Bld 147 (*)    BUN 22 (*)    Calcium 8.5 (*)    Total Protein 6.3 (*)    Total Bilirubin 1.6 (*)    All other components within normal limits  URINALYSIS, COMPLETE (UACMP) WITH MICROSCOPIC - Abnormal; Notable  for the following components:   Hgb urine dipstick TRACE (*)    Bilirubin Urine MODERATE (*)    Ketones, ur TRACE (*)  Protein, ur TRACE (*)    All other components within normal limits  CBC WITH DIFFERENTIAL/PLATELET    EKG   Radiology Ct Abdomen Pelvis W Wo Contrast  Result Date: 05/13/2019 CLINICAL DATA:  RLQ pain Hx of kidney stones. Pain x 3 days. Call report [phone] OMNIPAQUE IOHEXOL 300 MG/ML SOLNFlank pain, stone disease suspected Abd pain, appendicitis suspected EXAM: CT ABDOMEN AND PELVIS WITHOUT AND WITH CONTRAST TECHNIQUE: Multidetector CT imaging of the abdomen and pelvis was performed following the standard protocol before and following the bolus administration of intravenous contrast. CONTRAST:  OMNIPAQUE IOHEXOL 300 MG/ML  SOLN COMPARISON:  CT August 15, 2016 FINDINGS: Lower chest: Lung bases are clear. Hepatobiliary: No focal hepatic lesion. No biliary duct dilatation. Gallbladder is normal. Common bile duct is normal. Pancreas: Pancreas is normal. No ductal dilatation. No pancreatic inflammation. Spleen: Normal spleen Adrenals/urinary tract: Adrenal glands normal. Bilateral nonobstructing renal calculi. No ureterolithiasis or obstructive uropathy. No bladder calculi. Stomach/Bowel: Stomach duodenum are normal. The proximal small bowel normal. Along the distal ileum approximately 10 cm from the terminal ileum there is inflammation within the small bowel mesentery. There is a small inflamed diverticulum along the small bowel (image 65/5). There is a tiny focus of contained extraluminal gas at this level (image 63/5). More distally there is a second diverticulum which is not inflamed. The terminal ileum is normal. The appendix is normal. The ascending, transverse and descending colon normal. Rectosigmoid colon normal Vascular/Lymphatic: Abdominal aorta is normal caliber with atherosclerotic calcification. There is no retroperitoneal or periportal lymphadenopathy. No  pelvic lymphadenopathy. Reproductive: Prostate normal Other: No free fluid. Musculoskeletal: No aggressive osseous lesion. IMPRESSION: 1. Acute diverticulitis of the distal SMALL BOWEL approximately 10 cm from the terminal ileum. There is a small a contained micro perforation. No abscess formation 2. Normal appendix. 3. Bilateral nephrolithiasis without obstructive uropathy. Electronically Signed   By: Genevive Bi M.D.   On: 05/13/2019 11:31    Procedures Procedures (including critical care time)  Medications Ordered in UC Medications  iohexol (OMNIPAQUE) 300 MG/ML solution 150 mL (125 mLs Intravenous Contrast Given 05/13/19 1054)    Initial Impression / Assessment and Plan / UC Course  I have reviewed the triage vital signs and the nursing notes.  Pertinent labs & imaging results that were available during my care of the patient were reviewed by me and considered in my medical decision making (see chart for details).    58 year old male presents with right lower quadrant pain.  DDX: Acute appendicitis, diverticulitis, kidney stone.  Labs and urine obtained as well as CT abdomen and pelvis with and without contrast.  Found to have acute right-sided diverticulitis.  Placing on Cipro and Flagyl.  Vicodin for pain.  [place] controlled substance database reviewed.  No concerns for abuse.  Advised to go to the ER if he worsens.   Final Clinical Impressions(s) / UC Diagnoses   Final diagnoses:  Acute diverticulitis     Discharge Instructions     Medications as prescribed.  If you worsen (fever, chills, worsening pain), go to the hospital.  Take care  Dr. Adriana Simas     ED Prescriptions    Medication Sig Dispense Auth. Provider   ciprofloxacin (CIPRO) 500 MG tablet Take 1 tablet (500 mg total) by mouth 2 (two) times daily for 7 days. 14 tablet Lucelia Lacey G, DO   metroNIDAZOLE (FLAGYL) 500 MG tablet Take 1 tablet (500 mg total) by mouth 3 (three) times daily for 7 days. 21  tablet Everlene Other G, DO   HYDROcodone-acetaminophen (NORCO/VICODIN) 5-325 MG tablet Take 1 tablet by mouth every 8 (eight) hours as needed. 10 tablet Everlene Other G, DO     I have reviewed the PDMP during this encounter.   Tommie Sams, DO 05/13/19 1145    Tommie Sams, DO 05/13/19 1152

## 2019-05-13 NOTE — Discharge Instructions (Signed)
Medications as prescribed.  If you worsen (fever, chills, worsening pain), go to the hospital.  Take care  Dr. Lacinda Axon

## 2019-05-17 ENCOUNTER — Telehealth: Payer: Self-pay

## 2019-05-17 NOTE — Telephone Encounter (Signed)
Pt calls in regarding his Flagyl upsetting his stomach and his urine turning dark. No urinary sx. I spoke to Dr. Lacinda Axon and he is switching pt from Flagyl to Augmentin and instructed pt to increase his water/fluid intake. Return if no improvement or if develop urinary sx. Pt verbalized understanding.

## 2019-06-08 ENCOUNTER — Ambulatory Visit
Admission: EM | Admit: 2019-06-08 | Discharge: 2019-06-08 | Disposition: A | Discharge status: Home / Self Care | Payer: BLUE CROSS/BLUE SHIELD

## 2019-06-08 ENCOUNTER — Other Ambulatory Visit: Payer: Self-pay

## 2019-06-08 ENCOUNTER — Encounter: Payer: Self-pay | Admitting: Emergency Medicine

## 2019-06-08 DIAGNOSIS — W268XXA Contact with other sharp object(s), not elsewhere classified, initial encounter: Secondary | ICD-10-CM | POA: Diagnosis not present

## 2019-06-08 DIAGNOSIS — S71112A Laceration without foreign body, left thigh, initial encounter: Secondary | ICD-10-CM | POA: Diagnosis not present

## 2019-06-08 NOTE — ED Provider Notes (Signed)
[facility] ____________________________________________  Time seen: Approximately 2:58 PM  I have reviewed the triage vital signs and the nursing notes.   HISTORY  Chief Complaint Laceration (upper left leg)   HPI Maurice Lee is a 58 y.o. male presenting for evaluation of left thigh laceration that occurred just prior to arrival.  Patient reports that he was using a brand-new box cutter to open objects at home and excellently cut his leg.  Denies other injuries.  Last tetanus immunization within the last 10 years.  Denies pain to the area.  Reports otherwise doing well.  No recent fevers or sickness.   Past Medical History:  Diagnosis Date  . Complication of anesthesia   . Hypertension   . Pseudocholinesterase deficiency     Patient Active Problem List   Diagnosis Date Noted  . Primary osteoarthritis of left knee 03/26/2017  . Carpal tunnel syndrome on both sides 03/26/2017  . Elevated liver enzymes   . 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 Right upper quadrant abdominal pain 08/15/2016  . Near syncope 03/22/2016  . Left knee pain 08/24/2015  . Serum cholinesterase defect ([place]) 04/27/2015  . H/O renal calculi 02/22/2015  . Essential (primary) hypertension 06/21/2014    Past Surgical History:  Procedure Laterality Date  . COLONOSCOPY WITH PROPOFOL N/A 07/15/2016   Procedure: COLONOSCOPY WITH PROPOFOL;  Surgeon: Jonathon Bellows, MD;  Location: [facility] ENDOSCOPY;  Service: Endoscopy;  Laterality: N/A;  . ERCP N/A 08/16/2016   Procedure: ENDOSCOPIC RETROGRADE CHOLANGIOPANCREATOGRAPHY (ERCP);  Surgeon: Lucilla Lame, MD;  Location: [facility] ENDOSCOPY;  Service: Endoscopy;  Laterality: N/A;  . Kidney stones removed       No current facility-administered medications for this encounter.   Current Outpatient Medications:  .  acetaminophen (TYLENOL) 650 MG CR tablet, Take 650 mg by mouth every 8 (eight) hours as needed for pain., Disp: , Rfl:   .  famotidine (PEPCID) 40 MG tablet, Take 1 tablet (40 mg total) by mouth daily., Disp: 90 tablet, Rfl: 1 .  lisinopril (ZESTRIL) 40 MG tablet, TAKE 1 TABLET BY MOUTH ONCE DAILY, Disp: 90 tablet, Rfl: 3 .  HYDROcodone-acetaminophen (NORCO/VICODIN) 5-325 MG tablet, Take 1 tablet by mouth every 8 (eight) hours as needed., Disp: 10 tablet, Rfl: 0 .  meloxicam (MOBIC) 15 MG tablet, Take 15 mg by mouth daily., Disp: , Rfl:  .  naproxen sodium (ANAPROX) 550 MG tablet, naproxen sodium 550 mg tablet  Take 1 tablet every 12 hours by oral route., Disp: , Rfl:   Allergies Mivacurium, Succinylcholine, Nabumetone, and Sulfa antibiotics  Family History  Problem Relation Age of Onset  . COPD Mother   . Thyroid disease Mother   . Lupus Mother   . Congestive Heart Failure Mother   . Cancer Father     Social History Social History   Tobacco Use  . Smoking status: Former Smoker    Packs/day: 1.50    Years: 40.00    Pack years: 60.00  . Smokeless tobacco: Never Used  Substance Use Topics  . Alcohol use: No  . Drug use: No    Review of Systems Constitutional: No fever Cardiovascular: Denies chest pain. Respiratory: Denies shortness of breath. Musculoskeletal: Negative for leg pain. Skin: Positive laceration  ____________________________________________   PHYSICAL EXAM:  VITAL SIGNS: ED Triage Vitals  Enc Vitals Group     BP 06/08/19 1432 (!) 163/67     Pulse Rate 06/08/19 1432 72     Resp 06/08/19 1432  18     Temp 06/08/19 1432 98 F (36.7 C)     Temp Source 06/08/19 1432 Oral     SpO2 06/08/19 1432 99 %     Weight 06/08/19 1430 225 lb (102.1 kg)     Height 06/08/19 1430 5\' 7"  (1.702 m)     Head Circumference --      Peak Flow --      Pain Score 06/08/19 1429 0     Pain Loc --      Pain Edu? --      Excl. in [place]? --     Constitutional: Alert and oriented. Well appearing and in no acute distress. Eyes: Conjunctivae are normal.  ENT      Head: Normocephalic and atraumatic.  Cardiovascular:  Good peripheral circulation. Respiratory: Normal respiratory effort without tachypnea nor retractions.  Musculoskeletal: Steady gait. Neurologic:  Normal speech and language. Speech is normal. No gait instability.  Skin:  Skin is warm, dry.  Except: Left dorsal mid thigh 3 cm linear laceration with mild active bleeding and mild gaping, no foreign body, nontender, no surrounding erythema.  Psychiatric: Mood and affect are normal. Speech and behavior are normal. Patient exhibits appropriate insight and judgment   ___________________________________________   LABS (all labs ordered are listed, but only abnormal results are displayed)  Labs Reviewed - No data to display  PROCEDURES Procedures   Procedure(s) performed:  Procedure explained and verbal consent obtained. Consent: Verbal consent obtained. Written consent not obtained. Risks and benefits: risks, benefits and alternatives were discussed Patient identity confirmed: verbally with patient and hospital-assigned identification number  Consent given by: patient   Laceration Repair Location: Left thigh Length: 3 cm Foreign bodies: no foreign bodies Tendon involvement: none Nerve involvement: none Preparation: Patient was prepped and draped in the usual sterile fashion. Anesthesia with 1% Lidocaine 13/10/20 Cleaned with Betadine Irrigation solution: Sterile water Irrigation method: jet lavage Amount of cleaning: copious Repaired with 5-0 nylon Number of sutures: 3 Technique: simple interrupted  Approximation: loose Patient tolerate well. Wound well approximated post repair.  Antibiotic ointment and dressing applied.  Wound care instructions provided.  Observe for any signs of infection or other problems.     INITIAL IMPRESSION / ASSESSMENT AND PLAN / ED COURSE  Pertinent labs & imaging results that were available during my care of the patient were reviewed by me and considered in my medical decision making  (see chart for details).  Well-appearing patient.  Left thigh laceration.  Copiously cleaned, irrigated and repaired as above.  Supportive care and wound cleaning directions given.  Return in 7 to 10 days for suture removal.  Discussed follow up with Primary care physician this week as needed. Discussed follow up and return parameters including no resolution or any worsening concerns. Patient verbalized understanding and agreed to plan.   ____________________________________________   FINAL CLINICAL IMPRESSION(S) / ED DIAGNOSES  Final diagnoses:  Laceration of left thigh, initial encounter     ED Discharge Orders    None       Note: This dictation was prepared with Dragon dictation along with smaller phrase technology. Any transcriptional errors that result from this process are unintentional.         , NP 06/08/19 1505

## 2019-06-08 NOTE — Discharge Instructions (Addendum)
Keep clean and dry. Monitor. Topical antibiotic ointment.   Return in 7-10 days for suture removal.   Follow up with your primary care physician this week as needed. Return to Urgent care for new or worsening concerns.

## 2019-06-08 NOTE — ED Triage Notes (Signed)
Pt cut his left upper leg with a box cutter about an hour ago. Last tetanus was 06/21/2014

## 2019-06-18 ENCOUNTER — Other Ambulatory Visit: Payer: Self-pay

## 2019-06-18 ENCOUNTER — Encounter: Payer: Self-pay | Admitting: Emergency Medicine

## 2019-06-18 ENCOUNTER — Ambulatory Visit
Admission: EM | Admit: 2019-06-18 | Discharge: 2019-06-18 | Disposition: A | Discharge status: Home / Self Care | Payer: BLUE CROSS/BLUE SHIELD

## 2019-06-18 DIAGNOSIS — Z4802 Encounter for removal of sutures: Secondary | ICD-10-CM

## 2019-06-18 NOTE — ED Triage Notes (Signed)
Patient here to have sutures removed from his left upper leg.  Patient denies any problems or pain.

## 2019-06-30 ENCOUNTER — Ambulatory Visit (INDEPENDENT_AMBULATORY_CARE_PROVIDER_SITE_OTHER): Payer: BLUE CROSS/BLUE SHIELD | Admitting: Physician Assistant

## 2019-06-30 ENCOUNTER — Other Ambulatory Visit: Payer: Self-pay

## 2019-06-30 ENCOUNTER — Telehealth: Payer: Self-pay

## 2019-06-30 ENCOUNTER — Encounter: Payer: Self-pay | Admitting: Physician Assistant

## 2019-06-30 VITALS — BP 119/75 | HR 66 | Temp 96.6°F | Wt 219.8 lb

## 2019-06-30 DIAGNOSIS — M1711 Unilateral primary osteoarthritis, right knee: Secondary | ICD-10-CM

## 2019-06-30 DIAGNOSIS — M23203 Derangement of unspecified medial meniscus due to old tear or injury, right knee: Secondary | ICD-10-CM | POA: Diagnosis not present

## 2019-06-30 MED ORDER — METHYLPREDNISOLONE ACETATE 40 MG/ML IJ SUSP
80.0000 mg | Freq: Once | INTRAMUSCULAR | Status: AC
  Administered 2019-06-30: 80 mg via INTRA_ARTICULAR

## 2019-06-30 NOTE — Telephone Encounter (Signed)
Copied from [place] [phone]. Topic: General - Inquiry >> Jun 30, 2019 11:17 AM Scherrie Gerlach wrote: Reason for CRM: pt states he cannot go back today after the cortisone inj he had this am by Sonia Baller.  Pt would like to rest his knee.  Requesting work note to return to work tomorrow, 12/03 sent to his Mychart for access.

## 2019-06-30 NOTE — Telephone Encounter (Signed)
Letter sent.

## 2019-06-30 NOTE — Progress Notes (Signed)
Patient: Maurice Lee Male    DOB: 06-30-1961   58 y.o.   MRN: [id number] Visit Date: 06/30/2019  Today's Provider: Margaretann Loveless, PA-C   Chief Complaint  Patient presents with  . Knee Pain   Subjective:     Knee Pain  Incident onset: recurrent. There was no injury mechanism. The pain is present in the right knee and right leg. The quality of the pain is described as aching. The pain is at a severity of 10/10. The pain is severe. The pain has been constant since onset. Pertinent negatives include no numbness. He reports no foreign bodies present. The symptoms are aggravated by movement. He has tried NSAIDs for the symptoms. The treatment provided no relief.   Last steroid injection was given on 03/31/19 and lasted approx 1 month successfully and pain has been progressing since. Has seen [facility] and was given meloxicam. He reports this is not working. He would like a repeat injection today and will call to f/u with orthopedics to see what their next step will be.   Allergies  Allergen Reactions  . 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 Nabumetone Rash  . Sulfa Antibiotics Rash     Current Outpatient Medications:  .  acetaminophen (TYLENOL) 650 MG CR tablet, Take 650 mg by mouth every 8 (eight) hours as needed for pain., Disp: , Rfl:  .  Cannabidiol 100 MG/ML SOLN, Take by mouth., Disp: , Rfl:  .  famotidine (PEPCID) 40 MG tablet, Take 1 tablet (40 mg total) by mouth daily., Disp: 90 tablet, Rfl: 1 .  HYDROcodone-acetaminophen (NORCO/VICODIN) 5-325 MG tablet, Take 1 tablet by mouth every 8 (eight) hours as needed., Disp: 10 tablet, Rfl: 0 .  lisinopril (ZESTRIL) 40 MG tablet, TAKE 1 TABLET BY MOUTH ONCE DAILY, Disp: 90 tablet, Rfl: 3 .  meloxicam (MOBIC) 15 MG  tablet, Take 15 mg by mouth daily., Disp: , Rfl:  .  triamcinolone cream (KENALOG) 0.1 %, APP EXT AA BID, Disp: , Rfl:  .  naproxen sodium (ANAPROX) 550 MG tablet, naproxen sodium 550 mg tablet  Take 1 tablet every 12 hours by oral route., Disp: , Rfl:   Review of Systems  Constitutional: Negative.   Respiratory: Negative.   Cardiovascular: Negative.   Musculoskeletal: Positive for arthralgias, gait problem, joint swelling and myalgias.       Right knee pain   Neurological: Negative for weakness and numbness.    Social History   Tobacco Use  . Smoking status: Former Smoker    Packs/day: 1.50    Years: 40.00    Pack years: 60.00  . Smokeless tobacco: Never Used  Substance Use Topics  . Alcohol use: No      Objective:   BP 119/75 (BP Location: Left Arm, Patient Position: Sitting, Cuff Size: Normal)   Pulse 66   Temp (!) 96.6 F (35.9 C) (Temporal)   Wt 219 lb 12.8 oz (99.7 kg)   BMI 34.43 kg/m  Vitals:   06/30/19 1020  BP: 119/75  Pulse: 66  Temp: (!) 96.6 F (35.9 C)  TempSrc: Temporal  Weight: 219 lb 12.8 oz (99.7 kg)  Body mass index is 34.43 kg/m.   Physical Exam Vitals signs reviewed.  Constitutional:  General: He is not in acute distress.    Appearance: Normal appearance. He is well-developed. He is obese. He is not ill-appearing.  HENT:     Head: Normocephalic and atraumatic.  Eyes:     Conjunctiva/sclera: Conjunctivae normal.  Neck:     Musculoskeletal: Normal range of motion and neck supple.  Pulmonary:     Effort: Pulmonary effort is normal. No respiratory distress.  Musculoskeletal:     Right knee: He exhibits decreased range of motion, swelling and abnormal meniscus. He exhibits no effusion, no erythema, normal alignment, no LCL laxity, normal patellar mobility, no bony tenderness and no MCL laxity. Tenderness found. Medial joint line tenderness noted.  Neurological:     Mental Status: He is alert.  Psychiatric:        Behavior: Behavior  normal.        Thought Content: Thought content normal.        Judgment: Judgment normal.     Interface, Rad Results In - 01/04/2019  1:27 PM EDT EXAM: XR KNEE 4 OR MORE VIEWS RIGHT DATE: 01/04/2019 12:45 PM ACCESSION: [id number] UN DICTATED: 01/04/2019 1:25 PM INTERPRETATION LOCATION: [facility] CLINICAL INDICATION: 58 years old Male with right knee pain  - M25.561 - Right knee pain, unspecified chronicity    COMPARISON: None.  TECHNIQUE: AP and sunrise views of both knees as well as lateral and tunnel views of the right knee.  FINDINGS:  Right knee: Mild medial femorotibial narrowing with trace osteophytosis. Small effusion.  Left knee: Unicompartmental medial femorotibial arthroplasty. Components are well seated and articulate. No perihardware osteolysis.  No acute fracture in either knee.  IMPRESSION: Mild right knee medial femorotibial osteoarthrosis.  Interface, Rad Results In - 01/15/2019  4:33 PM EDT EXAM: MRI LOWER EXTREMITY JOINT RIGHT WO CONTRAST DATE: 01/15/2019 3:11 PM ACCESSION: [id number] UN DICTATED: 01/15/2019 4:30 PM INTERPRETATION LOCATION: [facility]  CLINICAL INDICATION: 58 years old Male with medial knee pain  - M25.561 - Right knee pain, unspecified chronicity - M19.90 - Arthritis    COMPARISON: 01/04/2019 radiographs  TECHNIQUE: MRI of the right knee was performed without contrast using a local coil.  Multisequence, multiplanar images were obtained.  FINDINGS:  Bone marrow (excluding subchondral bone): Within normal limits  Medial compartment: Complex medial meniscus tear with horizontal and vertical components involving the body and posterior horn and extrusion into the medial recess. Near full-thickness cartilage thinning of the weightbearing femoral and tibial articular surfaces with subchondral edema.  Lateral compartment: The lateral meniscus, articular cartilage, and subchondral bone are normal.  Patellofemoral compartment: The articular  cartilage and subchondral bone are normal.  The ligaments of the knee are intact.  The extensor mechanism is normal.  The medial and lateral patellar retinacula are normal. The popliteus tendon is normal.  Moderate knee effusion. Thin medial plica. Small popliteal cyst.  There are no loose intra-articular bodies.  Mild diffuse subcutaneous edema, anterior predominant.   IMPRESSION: Complex medial meniscus tear involving the posterior horn and body with extrusion into the medial recess. Intact lateral meniscus.  Near full-thickness chondrosis involving the weightbearing medial femorotibial compartment.  Moderate knee effusion. Small popliteal cyst.  Intact ligaments. No results found for any visits on 06/30/19.     Assessment & Plan    1. Primary osteoarthritis of right knee Has tried naproxen and meloxicam without relief. Last injection was 03/31/19. Advised patient that injections were becoming more frequent and [facility] He agrees. Depo-medrol injection given and tolerated well, see procedure note  below. Call if worsening.  - methylPREDNISolone acetate (DEPO-MEDROL) injection 80 mg  2. 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 - methylPREDNISolone acetate (DEPO-MEDROL) injection 80 mg  Procedure Note: Benefits, risks (including infection, tattooing, adipose dimpling, and tendon rupture) and alternatives were explained to the patient. All questions were sought and answered.  Patient agreed to continue and verbal consent was obtained.   An aspiration and steroid injection was performed on right knee using 4cc of 1% plain Xyloocaine and 80 mg of depo-medrol. There was minimal bleeding. Hemostasis was intact. A dry dressing was applied. The procedure was well tolerated.    Mar Daring, PA-C  [facility]

## 2019-06-30 NOTE — Patient Instructions (Signed)
Joint Steroid Injection A joint steroid injection is a procedure to relieve swelling and pain in a joint. Steroids are medicines that reduce inflammation. In this procedure, your health care provider uses a syringe and a needle to inject a steroid medicine into a painful and inflamed joint. A pain-relieving medicine (anesthetic) may be injected along with the steroid. In some cases, your health care provider may use an imaging technique such as ultrasound or fluoroscopy to guide the injection. Joints that are often treated with steroid injections include the knee, shoulder, hip, and spine. These injections may also be used in the elbow, ankle, and joints of the hands or feet. You may have joint steroid injections as part of your treatment for inflammation caused by:  Gout.  Rheumatoid arthritis.  Advanced wear-and-tear arthritis (osteoarthritis).  Tendinitis.  Bursitis. Joint steroid injections may be repeated, but having them too often can damage a joint or the skin over the joint. You should not have joint steroid injections less than 6 weeks apart or more than four times a year. Tell a health care provider about:  Any allergies you have.  All medicines you are taking, including vitamins, herbs, eye drops, creams, and over-the-counter medicines.  Any problems you or family members have had with anesthetic medicines.  Any blood disorders you have.  Any surgeries you have had.  Any medical conditions you have.  Whether you are pregnant or may be pregnant. What are the risks? Generally, this is a safe treatment. However, problems may occur, including:  Infection.  Bleeding.  Allergic reactions to medicines.  Damage to the joint or tissues around the joint.  Thinning of skin or loss of skin color over the joint.  Temporary flushing of the face or chest.  Temporary increase in pain.  Temporary increase in blood sugar.  Failure to relieve inflammation or pain. What  happens before the treatment?  You may have imaging tests of your joint.  Ask your health care provider about: ? Changing or stopping your regular medicines. This is especially important if you are taking diabetes medicines or blood thinners. ? Taking medicines such as aspirin and ibuprofen. These medicines can thin your blood. Do not take these medicines unless your health care provider tells you to take them. ? Taking over-the-counter medicines, vitamins, herbs, and supplements.  Ask your health care provider if you can drive yourself home after the procedure. What happens during the treatment?   Your health care provider will position you for the injection and locate the injection site over your joint.  The skin over the joint will be cleaned with a germ-killing soap.  Your health care provider may: ? Spray a numbing solution (topical anesthetic) over the injection site. ? Inject a local anesthetic under the skin above your joint.  The needle will be placed through your skin into your joint. Your health care provider may use imaging to guide the needle to the right spot for the injection. If imaging is used, a special contrast dye may be injected to confirm that the needle is in the correct location.  The steroid medicine will be injected into your joint.  Anesthetic may be injected along with the steroid. This may be a medicine that relieves pain for a short time (short-acting anesthetic) or for a longer time (long-acting anesthetic).  The needle will be removed, and an adhesive bandage (dressing) will be placed over the injection site. The procedure may vary among health care providers and hospitals. What can I   expect after the treatment?  You will be able to go home after the treatment.  It is normal to feel slight flushing for a few days after the injection.  After the treatment, it is common to have an increase in joint pain after the anesthetic has worn off. This may  happen about an hour after a short-acting anesthetic or about 8 hours after a longer-acting anesthetic.  You should begin to feel relief from joint pain and swelling after 24 to 48 hours. Follow these instructions at home: Injection site care  Leave the adhesive dressing over your injection site in place until your health care provider says you can remove it.  Check your injection site every day for signs of infection. Check for: ? Redness, swelling, or pain. ? Fluid or blood. ? Warmth. ? Pus or a bad smell. Activity  Return to your normal activities as told by your health care provider. Ask your health care provider what activities are safe for you. You may be asked to limit activities that put stress on the joint for a few days.  Do joint exercises as told by your health care provider.  Do not take baths, swim, or use a hot tub until your health care provider approves. Managing pain, stiffness, and swelling   If directed, put ice on the joint. ? Put ice in a plastic bag. ? Place a towel between your skin and the bag. ? Leave the ice on for 20 minutes, 2-3 times a day.  Raise (elevate) your joint above the level of your heart when you are sitting or lying down. General instructions  Take over-the-counter and prescription medicines only as told by your health care provider.  Do not use any products that contain nicotine or tobacco, such as cigarettes, e-cigarettes, and chewing tobacco. These can delay joint healing. If you need help quitting, ask your health care provider.  If you have diabetes, be aware that your blood sugar may be slightly elevated for several days after the injection.  Keep all follow-up visits as told by your health care provider. This is important. Contact a health care provider if you have:  Chills or a fever.  Any signs of infection at your injection site.  Increased pain or swelling or no relief after 2 days. Summary  A joint steroid injection  is a treatment to relieve pain and swelling in a joint.  Steroids are medicines that reduce inflammation. Your health care provider may add an anesthetic along with the steroid.  You may have joint steroid injections as part of your arthritis treatment.  Joint steroid injections may be repeated, but having them too often can damage a joint or the skin over the joint.  Contact your health care provider if you have a fever, chills, or signs of infection or if you get no relief from joint pain or swelling. This information is not intended to replace advice given to you by your health care provider. Make sure you discuss any questions you have with your health care provider. Document Released: 03/17/2018 Document Revised: 03/17/2018 Document Reviewed: 03/17/2018 Elsevier Patient Education  2020 [employer]  

## 2019-07-05 ENCOUNTER — Encounter: Payer: Self-pay | Admitting: Physician Assistant

## 2019-07-05 DIAGNOSIS — G8929 Other chronic pain: Secondary | ICD-10-CM

## 2019-07-05 MED ORDER — HYDROCODONE-ACETAMINOPHEN 5-325 MG PO TABS: 1.0000 | ORAL_TABLET | Freq: Three times a day (TID) | ORAL | 0 refills | Status: DC | PRN

## 2019-07-19 ENCOUNTER — Telehealth: Payer: Self-pay

## 2019-07-19 NOTE — Telephone Encounter (Signed)
If is pain is that severe and even the vicodin isnt helping him sleep, he may need ER evaluation to make sure nothing more serious is going on. He can go to [facility] if it is that severe. He does need work up and that would be the quickest way to work it up. I worry its coming from his back possibly.

## 2019-07-19 NOTE — Telephone Encounter (Signed)
Copied from [place] [id number]. Topic: General - Inquiry >> Jul 19, 2019 11:23 AM Berneta Levins wrote: Reason for CRM:   Pt's wife calling.  States that pt had lab work done at [facility] last week and it has "come back crazy" they want to know if PCP can look at it and explain it to them.  Pt's wife states that PCP has always been able to see their [facility] stuff.  Patient's wife is asking if everything looks stable on the labs then what is going on with his inner thigh pain. That patient has not been able to sleep because of the pain.

## 2019-07-19 NOTE — Telephone Encounter (Signed)
All I can see is his CBC. Has he had anything else done? His CBC looks fairly stable compared to other CBC.

## 2019-07-20 NOTE — Telephone Encounter (Signed)
Attempted to call patient with no answer.  °

## 2019-07-20 NOTE — Telephone Encounter (Signed)
Spoke with patient and informed him if his pain is that severe at times that he would need an in person evaluation and should be seen at the Urgent care or the ED. He gave verbal understanding.

## 2019-09-10 ENCOUNTER — Other Ambulatory Visit: Payer: Self-pay | Admitting: Physician Assistant

## 2019-09-10 DIAGNOSIS — G8929 Other chronic pain: Secondary | ICD-10-CM

## 2019-09-10 MED ORDER — HYDROCODONE-ACETAMINOPHEN 5-325 MG PO TABS: 1.0000 | ORAL_TABLET | Freq: Three times a day (TID) | ORAL | 0 refills | Status: DC | PRN

## 2019-09-10 NOTE — Progress Notes (Signed)
Refilled Norco. NCCSR reviewed.

## 2019-09-17 ENCOUNTER — Ambulatory Visit (INDEPENDENT_AMBULATORY_CARE_PROVIDER_SITE_OTHER): Payer: BLUE CROSS/BLUE SHIELD | Admitting: Physician Assistant

## 2019-09-17 ENCOUNTER — Encounter: Payer: Self-pay | Admitting: Physician Assistant

## 2019-09-17 ENCOUNTER — Other Ambulatory Visit: Payer: Self-pay

## 2019-09-17 VITALS — BP 145/72 | HR 64 | Temp 95.9°F | Wt 215.8 lb

## 2019-09-17 DIAGNOSIS — M79651 Pain in right thigh: Secondary | ICD-10-CM

## 2019-09-17 MED ORDER — METHYLPREDNISOLONE 4 MG PO TBPK: ORAL_TABLET | ORAL | 0 refills | Status: DC

## 2019-09-17 NOTE — Patient Instructions (Signed)
   Adductor Muscle Strain  An adductor muscle strain, also called a groin strain or pull, is an injury to the muscles or tendons on the upper, inner part of the thigh. These muscles are called the adductor muscles or groin muscles. They are responsible for moving the legs across the body or pulling the legs together. A muscle strain occurs when a muscle is overstretched and some muscle fibers are torn. An adductor muscle strain can range from mild to severe, depending on how many muscle fibers are affected and whether the muscle fibers are partially or completely torn. What are the causes? Adductor muscle strains usually occur during exercise or while participating in sports. The injury often happens when a sudden, violent force is placed on a muscle, stretching the muscle too far. A strain is more likely to happen when your muscles are not warmed up or if you are not properly conditioned. This injury may be caused by:  Stretching the adductor muscles too far or too suddenly, often during side-to-side motion with a sudden change in direction.  Putting repeated stress on the adductor muscles over a long period of time.  Performing vigorous activity without properly stretching the adductor muscles beforehand. What are the signs or symptoms? Symptoms of this condition include:  Pain and tenderness in the groin area. This begins as sharp pain and persists as a dull ache.  A popping or snapping feeling when the injury occurs (for severe strains).  Swelling or bruising.  Muscle spasms.  Weakness in the leg.  Stiffness in the groin area with decreased ability to move the affected muscles. How is this diagnosed? This condition may be diagnosed based on:  Your symptoms and a description of how the injury occurred.  A physical exam.  Imaging tests, such as: ? X-rays. These are sometimes needed to rule out a broken bone or cartilage problems. ? An ultrasound, CT scan, or MRI. These may  be done if your health care provider suspects a complete muscle tear or needs to check for other injuries. How is this treated? An adductor strain will often heal on its own. If needed, this condition may be treated with:  PRICE therapy. PRICE stands for protection of the injured area, rest, ice, pressure (compression), and elevation.  Medicines to help manage pain and swelling (anti-inflammatory medicines).  Crutches. You may be directed to use these for the first few days to minimize your pain. Depending on the severity of the muscle strain, recovery time may vary from a few weeks to several months. Severe injuries often require 4-6 weeks for recovery. In those cases, complete healing can take 4-5 months. Follow these instructions at home: PRICE Therapy   Protect the muscle from being injured again.  Rest. Do not use the strained muscle if it causes pain.  If directed, put ice on the injured area: ? Put ice in a plastic bag. ? Place a towel between your skin and the bag. ? Leave the ice on for 20 minutes, 2-3 times a day. Do this for the first 2 days after the injury.  Apply compression by wrapping the injured area with an elastic bandage as told by your health care provider.  Raise (elevate) the injured area above the level of your heart while you are sitting or lying down. General instructions  Take over-the-counter and prescription medicines only as told by your health care provider.  Walk, stretch, and do exercises as told by your health care provider. Only do   these activities if you can do so without any pain.  Follow your treatment plan as told by your health care provider. This may include: ? Physical therapy. ? Massage. ? Local electrical stimulation (transcutaneous electrical nerve stimulation, TENS). How is this prevented?  Warm up and stretch before being active.  Cool down and stretch after being active.  Give your body time to rest between periods of activity.   Make sure to use equipment that fits you.  Be safe and responsible while being active to avoid slips and falls.  Maintain physical fitness, including: ? Proper conditioning in the adductor muscles. ? Overall strength, flexibility, and endurance. Contact a health care provider if:  You have increased pain or swelling in the affected area.  Your symptoms are not improving or they are getting worse. Summary  An adductor muscle strain, also called a groin strain or pull, is an injury to the muscles or tendons on the upper, inner part of the thigh.  A muscle strain occurs when a muscle is overstretched and some muscle fibers are torn.  Depending on the severity of the muscle strain, recovery time may vary from a few weeks to several months. This information is not intended to replace advice given to you by your health care provider. Make sure you discuss any questions you have with your health care provider. Document Revised: 11/03/2018 Document Reviewed: 12/15/2017 Elsevier Patient Education  2020 [employer]      Adductor Muscle Strain With Rehab Ask your health care provider which exercises are safe for you. Do exercises exactly as told by your health care provider and adjust them as directed. It is normal to feel mild stretching, pulling, tightness, or mild discomfort as you do these exercises. Stop right away if you feel sudden pain or your pain gets worse. Do not begin these exercises until told by your health care provider. Strengthening exercises These exercises build strength and endurance in your thighs. Endurance is the ability to use your muscles for a long time, even after your muscles get tired. Hip adductor isometrics  This exercise is sometimes called inner thigh squeeze. 1. Sit on a firm chair that positions your knees at about the same height as your hips. 2. Place a large ball, firm pillow, or rolled-up bath towel between your thighs. 3. Squeeze your thighs  together, gradually building tension. 4. Hold for __________ seconds. 5. Release the tension gradually. Allow your inner thigh muscles to relax completely before you start the next repetition. Repeat __________ times. Complete this exercise __________ times a day. Hip adduction  This exercise is sometimes called side lying straight leg raises. 1. Lie on your side so your head, shoulder, knee, and hip are in a straight line with each other. To help maintain your balance, you may put the foot of your top leg in front of the leg that is on the floor. Your left / right leg should be on the bottom. 2. Roll your hips slightly forward so your hips are stacked directly over each other and your left / right knee is facing forward. 3. Tense the muscles of your inner thigh and lift your bottom leg 4-6 inches (10-15 cm). 4. Hold this position for __________ seconds. 5. Slowly lower your leg to the starting position. 6. Allow your muscles to relax completely before you start the next repetition. Repeat __________ times. Complete this exercise __________ times a day. Hip extension  This exercise is sometimes called prone (on your belly) straight   leg raises. 1. Lie on your belly on a bed or a firm surface with a pillow under your hips. 2. Tense your buttock muscles and lift your left / right thigh off the bed. Your left / right knee can be bent or straight, but do not let your back arch. 3. Hold this position for __________ seconds. 4. Slowly return to the starting position. 5. Allow your muscles to relax completely before you start the next repetition. Repeat __________ times. Complete this exercise __________ times a day. Balance exercises These exercises improve or maintain your balance. Balance is important in preventing falls. Single-leg balance 1. Stand near a railing or by a door frame that you can hold onto as needed. 2. Stand on your left / right foot. Keep your big toe down on the floor and try  to keep your arch lifted. 3. If this is too easy, you can stand with your eyes closed or stand on a pillow. 4. Hold this position for __________ seconds. Repeat __________ times. Complete this exercise __________ times a day. Side lunges 1. Stand with your feet together. 2. Keeping one foot in place, step to the side with your other foot about __________ inches (__________ cm). Do not step so far that you feel discomfort in your middle thigh. 3. Push off from your stepping foot to return to the starting position. Repeat __________ times. Complete this exercise __________ times a day. This information is not intended to replace advice given to you by your health care provider. Make sure you discuss any questions you have with your health care provider. Document Revised: 11/03/2018 Document Reviewed: 04/14/2018 Elsevier Patient Education  2020 Elsevier Inc.            

## 2019-09-17 NOTE — Progress Notes (Signed)
Patient: Maurice Lee Male    DOB: 10-Feb-1961   59 y.o.   MRN: [id number] Visit Date: 09/17/2019  Today's Provider: Margaretann Loveless, PA-C   Chief Complaint  Patient presents with  . Leg Pain    Thigh    Subjective:     Leg Pain  The incident occurred more than 1 week ago. The injury mechanism is unknown. The pain is present in the right thigh. The quality of the pain is described as aching. The pain is at a severity of 5/10 (when pt lays down it is a 10). Pain severity now: gets worse when laying down. The pain has been fluctuating since onset. Pertinent negatives include no inability to bear weight or numbness. The symptoms are aggravated by movement. Treatments tried: pain meds. The treatment provided no relief.   Patient recently had knee arthroscopic surgery on 08/19/19. He has had no complications. The thigh pain has been present prior to the knee surgery. The nighttime thigh pain started back in December and was thought at the time to be secondary to the knee pain. Had been using Norco 5-325mg  intermittently for the pain. He has also recently started Meloxicam with tylenol for the pain. He has not been using the Norco as he states it causes him some drowsiness/hungover sensation the next morning and it was not helping the pain that much. No known injury. Was noted that prior to the thigh pain he was having to squat more at work. Now he is bending over instead of squatting. Denies back pain. No numbness or tingling. No limited ROM.    Allergies  Allergen Reactions  . 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 Nabumetone Rash  . Sulfa Antibiotics Rash     Current Outpatient Medications:  .  acetaminophen (TYLENOL) 650 MG CR tablet, Take 650 mg by mouth every 8  (eight) hours as needed for pain., Disp: , Rfl:  .  Cannabidiol 100 MG/ML SOLN, Take by mouth., Disp: , Rfl:  .  famotidine (PEPCID) 40 MG tablet, Take 1 tablet (40 mg total) by mouth daily., Disp: 90 tablet, Rfl: 1 .  HYDROcodone-acetaminophen (NORCO/VICODIN) 5-325 MG tablet, Take 1 tablet by mouth every 8 (eight) hours as needed., Disp: 30 tablet, Rfl: 0 .  lisinopril (ZESTRIL) 40 MG tablet, TAKE 1 TABLET BY MOUTH ONCE DAILY, Disp: 90 tablet, Rfl: 3 .  meloxicam (MOBIC) 15 MG tablet, Take 15 mg by mouth daily., Disp: , Rfl:  .  naproxen sodium (ANAPROX) 550 MG tablet, naproxen sodium 550 mg tablet  Take 1 tablet every 12 hours by oral route., Disp: , Rfl:  .  triamcinolone cream (KENALOG) 0.1 %, APP EXT AA BID, Disp: , Rfl:   Review of Systems  Constitutional: Negative.   Respiratory: Negative.   Cardiovascular: Negative.  Negative for leg swelling.  Gastrointestinal: Negative.   Musculoskeletal: Positive for arthralgias (improving since surgery) and myalgias. Negative for back pain and gait problem.  Skin: Negative for color change.  Neurological: Negative for numbness.    Social History   Tobacco Use  . Smoking status: Former Smoker    Packs/day: 1.50    Years: 40.00    Pack years: 60.00  . Smokeless tobacco: Never Used  Substance Use Topics  . Alcohol  use: No      Objective:   There were no vitals taken for this visit. There were no vitals filed for this visit.There is no height or weight on file to calculate BMI.   Physical Exam Vitals reviewed.  Constitutional:      General: He is not in acute distress.    Appearance: Normal appearance. He is well-developed. He is obese. He is not ill-appearing.  HENT:     Head: Normocephalic and atraumatic.  Eyes:     Conjunctiva/sclera: Conjunctivae normal.  Cardiovascular:     Pulses:          Dorsalis pedis pulses are 2+ on the right side and 2+ on the left side.       Posterior tibial pulses are 2+ on the right side and 2+  on the left side.  Pulmonary:     Effort: Pulmonary effort is normal. No respiratory distress.  Musculoskeletal:     Cervical back: Normal range of motion and neck supple.     Lumbar back: Normal. No spasms, tenderness or bony tenderness. Normal range of motion. Negative right straight leg raise test and negative left straight leg raise test.     Right hip: No tenderness or bony tenderness. Normal range of motion. Normal strength.     Right upper leg: Tenderness (tenderness to palpation over all anterior and lateral musculature, with most tenderness over adductor group) present. No swelling or bony tenderness.     Right knee: Swelling (mild swelling noted suprapatellar; improved from previous) present. No erythema or bony tenderness. Normal range of motion. No tenderness. Normal alignment and normal patellar mobility. Normal pulse.     Right lower leg: No swelling. No edema.     Left lower leg: No edema.  Skin:    General: Skin is warm and dry.     Capillary Refill: Capillary refill takes less than 2 seconds.  Neurological:     General: No focal deficit present.     Mental Status: He is alert. Mental status is at baseline.     Motor: No weakness.     Gait: Gait normal.  Psychiatric:        Mood and Affect: Mood normal.        Behavior: Behavior normal.        Thought Content: Thought content normal.        Judgment: Judgment normal.     No results found for any visits on 09/17/19.     Assessment & Plan    1. Right thigh pain Possible muscle strain, but atypical as pain is worse at night when at rest. No clinical signs of DVT and pain has been present since before surgery. Pedal pulses normal. Neuro grossly intact. ROM and strength normal. Will treat with medrol dose pak as below. Advised to take 1/2-1 tab of Flexeril (already has at home) at bedtime. Call by Monday if not improving and will consider further work up with imaging.  - methylPREDNISolone (MEDROL) 4 MG TBPK tablet; 6 day  taper; take as directed on package instructions  Dispense: 21 tablet; Refill: 0     Mar Daring, PA-C  [facility]

## 2019-09-20 ENCOUNTER — Encounter: Payer: Self-pay | Admitting: Physician Assistant

## 2020-01-16 ENCOUNTER — Ambulatory Visit
Admission: EM | Admit: 2020-01-16 | Discharge: 2020-01-16 | Disposition: A | Discharge status: Home / Self Care | Payer: BLUE CROSS/BLUE SHIELD | Attending: Internal Medicine | Admitting: Internal Medicine

## 2020-01-16 ENCOUNTER — Other Ambulatory Visit: Payer: Self-pay

## 2020-01-16 DIAGNOSIS — Z8349 Family history of other endocrine, nutritional and metabolic diseases: Secondary | ICD-10-CM | POA: Insufficient documentation

## 2020-01-16 DIAGNOSIS — Z87891 Personal history of nicotine dependence: Secondary | ICD-10-CM | POA: Insufficient documentation

## 2020-01-16 DIAGNOSIS — Z79899 Other long term (current) drug therapy: Secondary | ICD-10-CM | POA: Insufficient documentation

## 2020-01-16 DIAGNOSIS — Z825 Family history of asthma and other chronic lower respiratory diseases: Secondary | ICD-10-CM | POA: Diagnosis not present

## 2020-01-16 DIAGNOSIS — Z882 Allergy status to sulfonamides status: Secondary | ICD-10-CM | POA: Diagnosis not present

## 2020-01-16 DIAGNOSIS — Z888 Allergy status to other drugs, medicaments and biological substances status: Secondary | ICD-10-CM | POA: Insufficient documentation

## 2020-01-16 DIAGNOSIS — Z8249 Family history of ischemic heart disease and other diseases of the circulatory system: Secondary | ICD-10-CM | POA: Diagnosis not present

## 2020-01-16 DIAGNOSIS — B349 Viral infection, unspecified: Secondary | ICD-10-CM | POA: Insufficient documentation

## 2020-01-16 DIAGNOSIS — I10 Essential (primary) hypertension: Secondary | ICD-10-CM | POA: Insufficient documentation

## 2020-01-16 DIAGNOSIS — Z20822 Contact with and (suspected) exposure to covid-19: Secondary | ICD-10-CM | POA: Insufficient documentation

## 2020-01-16 DIAGNOSIS — R05 Cough: Secondary | ICD-10-CM | POA: Diagnosis present

## 2020-01-16 MED ORDER — BENZONATATE 100 MG PO CAPS: 100.0000 mg | ORAL_CAPSULE | Freq: Three times a day (TID) | ORAL | 0 refills | Status: DC

## 2020-01-16 NOTE — ED Provider Notes (Signed)
[facility]    CSN: [id number] Arrival date & time: 01/16/20  1433      History   Chief Complaint Chief Complaint  Patient presents with  . Cough    HPI Maurice Lee is a 59 y.o. male comes to the urgent care with nonproductive cough and chest congestion over the past 5 days.  Patient had a fever at the outset of his symptoms but that resolved.  He continues to have a cough which is nonproductive.  No shortness of breath, sore throat, loss of taste or smell or generalized body aches.  No sick contacts.  Patient has been fully vaccinated against COVID-19 virus.  No dizziness, near syncope or syncopal episodes.Marland Kitchen   HPI  Past Medical History:  Diagnosis Date  . Complication of anesthesia   . Hypertension   . Pseudocholinesterase deficiency     Patient Active Problem List   Diagnosis Date Noted  . Primary osteoarthritis of left knee 03/26/2017  . Carpal tunnel syndrome on both sides 03/26/2017  . Elevated liver enzymes   . 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 Right upper quadrant abdominal pain 08/15/2016  . Near syncope 03/22/2016  . Left knee pain 08/24/2015  . Serum cholinesterase defect (HCC) 04/27/2015  . H/O renal calculi 02/22/2015  . Essential (primary) hypertension 06/21/2014    Past Surgical History:  Procedure Laterality Date  . COLONOSCOPY WITH PROPOFOL N/A 07/15/2016   Procedure: COLONOSCOPY WITH PROPOFOL;  Surgeon: Wyline Mood, MD;  Location: [facility] ENDOSCOPY;  Service: Endoscopy;  Laterality: N/A;  . ERCP N/A 08/16/2016   Procedure: ENDOSCOPIC RETROGRADE CHOLANGIOPANCREATOGRAPHY (ERCP);  Surgeon: Midge Minium, MD;  Location: [facility] ENDOSCOPY;  Service: Endoscopy;  Laterality: N/A;  . Kidney stones removed         Home Medications    Prior to Admission medications   Medication Sig Start Date End Date Taking? Authorizing Provider  acetaminophen (TYLENOL) 650 MG CR tablet Take 650 mg by mouth every 8 (eight) hours  as needed for pain.   Yes [provider]  famotidine (PEPCID) 40 MG tablet Take 1 tablet (40 mg total) by mouth daily. 03/31/19  Yes Margaretann Loveless, PA-C  lisinopril (ZESTRIL) 40 MG tablet TAKE 1 TABLET BY MOUTH ONCE DAILY 02/08/19  Yes Margaretann Loveless, PA-C  naproxen sodium (ANAPROX) 550 MG tablet naproxen sodium 550 mg tablet  Take 1 tablet every 12 hours by oral route.   Yes [provider]  benzonatate (TESSALON) 100 MG capsule Take 1 capsule (100 mg total) by mouth every 8 (eight) hours. 01/16/20   LampteyBritta Mccreedy, MD    Family History Family History  Problem Relation Age of Onset  . COPD Mother   . Thyroid disease Mother   . Lupus Mother   . Congestive Heart Failure Mother   . Cancer Father     Social History Social History   Tobacco Use  . Smoking status: Former Smoker    Packs/day: 1.50    Years: 40.00    Pack years: 60.00  . Smokeless tobacco: Never Used  Vaping Use  . Vaping Use: Never used  Substance Use Topics  . Alcohol use: No  . Drug use: No     Allergies   Mivacurium, Succinylcholine, Nabumetone, and Sulfa antibiotics   Review of Systems Review of Systems  HENT: Positive for congestion. Negative for ear discharge, facial swelling, rhinorrhea, sinus pressure and sinus pain.   Respiratory: Positive for cough. Negative for  chest tightness, shortness of breath and wheezing.   Gastrointestinal: Negative for diarrhea, nausea and vomiting.  Musculoskeletal: Negative for arthralgias.     Physical Exam Triage Vital Signs ED Triage Vitals  Enc Vitals Group     BP 01/16/20 1455 (!) 142/67     Pulse Rate 01/16/20 1455 80     Resp 01/16/20 1455 18     Temp 01/16/20 1455 98.2 F (36.8 C)     Temp Source 01/16/20 1455 Oral     SpO2 01/16/20 1455 99 %     Weight 01/16/20 1452 210 lb (95.3 kg)     Height 01/16/20 1452 5\' 7"  (1.702 m)     Head Circumference --      Peak Flow --      Pain Score 01/16/20 1452 3     Pain Loc --        Pain Edu? --      Excl. in GC? --    No data found.  Updated Vital Signs BP (!) 142/67 (BP Location: Left Arm)   Pulse 80   Temp 98.2 F (36.8 C) (Oral)   Resp 18   Ht 5\' 7"  (1.702 m)   Wt 95.3 kg   SpO2 99%   BMI 32.89 kg/m   Visual Acuity Right Eye Distance:   Left Eye Distance:   Bilateral Distance:    Right Eye Near:   Left Eye Near:    Bilateral Near:     Physical Exam Vitals and nursing note reviewed.  Constitutional:      General: He is not in acute distress.    Appearance: He is not ill-appearing.  HENT:     Right Ear: Tympanic membrane normal.     Left Ear: Tympanic membrane normal.  Cardiovascular:     Rate and Rhythm: Normal rate and regular rhythm.     Pulses: Normal pulses.     Heart sounds: Normal heart sounds. No murmur heard.  No friction rub.  Pulmonary:     Effort: Pulmonary effort is normal.     Breath sounds: Normal breath sounds.  Abdominal:     General: Bowel sounds are normal.     Palpations: Abdomen is soft.  Musculoskeletal:        General: Normal range of motion.  Neurological:     Mental Status: He is alert.      UC Treatments / Results  Labs (all labs ordered are listed, but only abnormal results are displayed) Labs Reviewed  SARS CORONAVIRUS 2 (TAT 6-24 HRS)    EKG   Radiology No results found.  Procedures Procedures (including critical care time)  Medications Ordered in UC Medications - No data to display  Initial Impression / Assessment and Plan / UC Course  I have reviewed the triage vital signs and the nursing notes.  Pertinent labs & imaging results that were available during my care of the patient were reviewed by me and considered in my medical decision making (see chart for details).     1.  Acute viral pharyngitis: Covid PCR testing sent Tessalon Perles as needed for cough Patient is advised to continue hydrating If patient symptoms worsens he is advised to return to urgent care to be  reevaluated. Patient advised to quarantine until COVID-19 test results are available. Final Clinical Impressions(s) / UC Diagnoses   Final diagnoses:  Acute viral syndrome   Discharge Instructions   None    ED Prescriptions    Medication Sig Dispense Auth.  Provider   benzonatate (TESSALON) 100 MG capsule Take 1 capsule (100 mg total) by mouth every 8 (eight) hours. 21 capsule Areli Frary, Myrene Galas, MD     PDMP not reviewed this encounter.   Chase Picket, MD 01/16/20 1600

## 2020-01-16 NOTE — ED Triage Notes (Signed)
Patient states that he has been coughing since Tuesday morning with congestion. Patient states that he did have a fever on 1 day and reports pain in rib cage with coughing now. States that he did have the [place] and [place] vaccine in March.

## 2020-01-17 LAB — SARS CORONAVIRUS 2 (TAT 6-24 HRS): SARS Coronavirus 2: NEGATIVE

## 2020-03-16 ENCOUNTER — Ambulatory Visit
Admission: EM | Admit: 2020-03-16 | Discharge: 2020-03-16 | Disposition: A | Discharge status: Home / Self Care | Payer: BLUE CROSS/BLUE SHIELD | Attending: Family | Admitting: Family

## 2020-03-16 ENCOUNTER — Other Ambulatory Visit: Payer: Self-pay

## 2020-03-16 DIAGNOSIS — R059 Cough, unspecified: Secondary | ICD-10-CM

## 2020-03-16 DIAGNOSIS — Z87891 Personal history of nicotine dependence: Secondary | ICD-10-CM | POA: Diagnosis not present

## 2020-03-16 DIAGNOSIS — I10 Essential (primary) hypertension: Secondary | ICD-10-CM | POA: Diagnosis not present

## 2020-03-16 DIAGNOSIS — Z20822 Contact with and (suspected) exposure to covid-19: Secondary | ICD-10-CM | POA: Insufficient documentation

## 2020-03-16 DIAGNOSIS — R519 Headache, unspecified: Secondary | ICD-10-CM | POA: Diagnosis not present

## 2020-03-16 DIAGNOSIS — G5603 Carpal tunnel syndrome, bilateral upper limbs: Secondary | ICD-10-CM | POA: Insufficient documentation

## 2020-03-16 DIAGNOSIS — Z8249 Family history of ischemic heart disease and other diseases of the circulatory system: Secondary | ICD-10-CM | POA: Insufficient documentation

## 2020-03-16 DIAGNOSIS — Z882 Allergy status to sulfonamides status: Secondary | ICD-10-CM | POA: Diagnosis not present

## 2020-03-16 DIAGNOSIS — J069 Acute upper respiratory infection, unspecified: Secondary | ICD-10-CM | POA: Diagnosis present

## 2020-03-16 DIAGNOSIS — R05 Cough: Secondary | ICD-10-CM

## 2020-03-16 DIAGNOSIS — Z79899 Other long term (current) drug therapy: Secondary | ICD-10-CM | POA: Diagnosis not present

## 2020-03-16 LAB — SARS CORONAVIRUS 2 (TAT 6-24 HRS): SARS Coronavirus 2: NEGATIVE

## 2020-03-16 MED ORDER — BENZONATATE 100 MG PO CAPS: 200.0000 mg | ORAL_CAPSULE | Freq: Three times a day (TID) | ORAL | 0 refills | Status: AC | PRN

## 2020-03-16 NOTE — ED Triage Notes (Signed)
Patient in today w/ c/o cough, sore throat, dry cough, sinus congestion, and H/A x 3-5 days. Patient denies fever, loss of taste or smell.

## 2020-03-16 NOTE — Discharge Instructions (Signed)

## 2020-03-16 NOTE — ED Provider Notes (Signed)
[facility]    CSN: [id number] Arrival date & time: 03/16/20  1053      History   Chief Complaint Chief Complaint  Patient presents with  . Cough  . Headache  . Nasal Congestion    HPI Maurice Lee is a 59 y.o. male.   59 y/o male presents for 4-5 day history of productive cough, congestion, sore throat, headaches, fatigue. Denies known COVID exposure and he is fully vaccinated for COVID 19. Denies fever, sinus pain, ear pain, chest pain, SOB, n/v/d, smell/taste changes. He says he has taken Nyquil occasionally but says it makes him feel hungover in the morning. Denies cardiopulmonary disease and is a former smoker. No other concerns today.     Past Medical History:  Diagnosis Date  . Complication of anesthesia   . Hypertension   . Pseudocholinesterase deficiency     Patient Active Problem List   Diagnosis Date Noted  . Primary osteoarthritis of left knee 03/26/2017  . Carpal tunnel syndrome on both sides 03/26/2017  . Elevated liver enzymes   . 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 Right upper quadrant abdominal pain 08/15/2016  . Near syncope 03/22/2016  . Left knee pain 08/24/2015  . Serum cholinesterase defect (HCC) 04/27/2015  . H/O renal calculi 02/22/2015  . Essential (primary) hypertension 06/21/2014    Past Surgical History:  Procedure Laterality Date  . COLONOSCOPY WITH PROPOFOL N/A 07/15/2016   Procedure: COLONOSCOPY WITH PROPOFOL;  Surgeon: Wyline Mood, MD;  Location: [facility] ENDOSCOPY;  Service: Endoscopy;  Laterality: N/A;  . ERCP N/A 08/16/2016   Procedure: ENDOSCOPIC RETROGRADE CHOLANGIOPANCREATOGRAPHY (ERCP);  Surgeon: Midge Minium, MD;  Location: [facility] ENDOSCOPY;  Service: Endoscopy;  Laterality: N/A;  . Kidney stones removed         Home Medications    Prior to Admission medications   Medication Sig Start Date End Date Taking? Authorizing Provider  acetaminophen (TYLENOL) 650 MG CR tablet Take 650 mg  by mouth every 8 (eight) hours as needed for pain.   Yes [provider]  famotidine (PEPCID) 40 MG tablet Take 1 tablet (40 mg total) by mouth daily. 03/31/19  Yes Margaretann Loveless, PA-C  lisinopril (ZESTRIL) 40 MG tablet TAKE 1 TABLET BY MOUTH ONCE DAILY 02/08/19  Yes Margaretann Loveless, PA-C  naproxen sodium (ANAPROX) 550 MG tablet naproxen sodium 550 mg tablet  Take 1 tablet every 12 hours by oral route.   Yes [provider]  atorvastatin (LIPITOR) 40 MG tablet Take 40 mg by mouth daily. 01/24/20   [provider]  benzonatate (TESSALON) 100 MG capsule Take 2 capsules (200 mg total) by mouth 3 (three) times daily as needed for up to 10 days for cough. 03/16/20 03/26/20  Shirlee Latch, PA-C    Family History Family History  Problem Relation Age of Onset  . COPD Mother   . Thyroid disease Mother   . Lupus Mother   . Congestive Heart Failure Mother   . Cancer Father     Social History Social History   Tobacco Use  . Smoking status: Former Smoker    Packs/day: 1.50    Years: 40.00    Pack years: 60.00  . Smokeless tobacco: Never Used  Vaping Use  . Vaping Use: Never used  Substance Use Topics  . Alcohol use: No  . Drug use: No     Allergies   Mivacurium, Succinylcholine, Nabumetone, and Sulfa antibiotics   Review of Systems  Review of Systems  Constitutional: Positive for fatigue. Negative for chills, diaphoresis and fever.  HENT: Positive for congestion, rhinorrhea, sinus pressure and sore throat.   Respiratory: Positive for cough. Negative for shortness of breath and wheezing.   Cardiovascular: Negative for chest pain and palpitations.  Gastrointestinal: Negative for abdominal pain, nausea and vomiting.  Musculoskeletal: Positive for myalgias.  Skin: Negative for rash.  Neurological: Positive for headaches. Negative for weakness.     Physical Exam Triage Vital Signs ED Triage Vitals  Enc Vitals Group     BP 03/16/20 1204 (!)  130/59     Pulse Rate 03/16/20 1204 75     Resp 03/16/20 1204 18     Temp 03/16/20 1204 98 F (36.7 C)     Temp Source 03/16/20 1204 Oral     SpO2 03/16/20 1204 98 %     Weight 03/16/20 1207 220 lb (99.8 kg)     Height 03/16/20 1207 5\' 7"  (1.702 m)     Head Circumference --      Peak Flow --      Pain Score 03/16/20 1206 3     Pain Loc --      Pain Edu? --      Excl. in GC? --    No data found.  Updated Vital Signs BP (!) 130/59 (BP Location: Right Arm)   Pulse 75   Temp 98 F (36.7 C) (Oral)   Resp 18   Ht 5\' 7"  (1.702 m)   Wt 220 lb (99.8 kg)   SpO2 98%   BMI 34.46 kg/m       Physical Exam Vitals and nursing note reviewed.  Constitutional:      General: He is not in acute distress.    Appearance: Normal appearance. He is well-developed. He is not diaphoretic.  HENT:     Head: Normocephalic and atraumatic.     Right Ear: Tympanic membrane, ear canal and external ear normal.     Left Ear: Tympanic membrane, ear canal and external ear normal.     Nose: Rhinorrhea present.     Mouth/Throat:     Pharynx: Uvula midline. Posterior oropharyngeal erythema (mild, clear PND) present. No oropharyngeal exudate.     Tonsils: No tonsillar abscesses.  Eyes:     General: No scleral icterus.       Right eye: No discharge.        Left eye: No discharge.     Conjunctiva/sclera: Conjunctivae normal.     Pupils: Pupils are equal, round, and reactive to light.  Neck:     Thyroid: No thyromegaly.     Trachea: No tracheal deviation.  Cardiovascular:     Rate and Rhythm: Normal rate and regular rhythm.     Heart sounds: Normal heart sounds.  Pulmonary:     Effort: Pulmonary effort is normal. No respiratory distress.     Breath sounds: Normal breath sounds. No stridor. No wheezing or rales.  Chest:     Chest wall: No tenderness.  Musculoskeletal:     Cervical back: Normal range of motion and neck supple.  Lymphadenopathy:     Cervical: No cervical adenopathy.  Skin:     General: Skin is warm and dry.     Findings: No rash.  Neurological:     General: No focal deficit present.     Mental Status: He is alert. Mental status is at baseline.     Motor: No weakness.     Gait: Gait  normal.  Psychiatric:        Mood and Affect: Mood normal.        Thought Content: Thought content normal.      UC Treatments / Results  Labs (all labs ordered are listed, but only abnormal results are displayed) Labs Reviewed  SARS CORONAVIRUS 2 (TAT 6-24 HRS)    EKG   Radiology No results found.  Procedures Procedures (including critical care time)  Medications Ordered in UC Medications - No data to display  Initial Impression / Assessment and Plan / UC Course  I have reviewed the triage vital signs and the nursing notes.  Pertinent labs & imaging results that were available during my care of the patient were reviewed by me and considered in my medical decision making (see chart for details).   59 y/o male presenting for cough, congestion, sore throat, headaches for a few days. VSS and exam reveals only rhinorrhea and mild pharyngeal erythema. COVID test sent. Low suspicion for COVID but advised isolating until results return.   Sent tessalon perles for cough. Advised following up for new/worse symptoms or if not better in 7-10 days   Final Clinical Impressions(s) / UC Diagnoses   Final diagnoses:  Viral upper respiratory tract infection  Cough  Acute nonintractable headache, unspecified headache type     Discharge Instructions     URI/COLD SYMPTOMS: Your exam today is consistent with a viral illness. Antibiotics are not indicated at this time. Use medications as directed, including cough syrup, nasal saline, and decongestants. Your symptoms should improve over the next few days and resolve within 7-10 days. Increase rest and fluids. F/u if symptoms worsen or predominate such as sore throat, ear pain, productive cough, shortness of breath, or if you  develop high fevers or worsening fatigue over the next several days.    You have received COVID testing today either for positive exposure, concerning symptoms that could be related to COVID infection, screening purposes, or re-testing after confirmed positive.  Your test obtained today checks for active viral infection in the last 1-2 weeks. If your test is negative now, you can still test positive later. So, if you do develop symptoms you should either get re-tested and/or isolate x 10 days. Please follow CDC guidelines.  While Rapid antigen tests come back in 15-20 minutes, send out PCR/molecular test results typically come back within 24 hours. In the mean time, if you are symptomatic, assume this could be a positive test and treat/monitor yourself as if you do have COVID.   We will call with test results. Please download the MyChart app and set up a profile to access test results.   If symptomatic, go home and rest. Push fluids. Take Tylenol as needed for discomfort. Gargle warm salt water. Throat lozenges. Take Mucinex DM or Robitussin for cough. Humidifier in bedroom to ease coughing. Warm showers. Also review the COVID handout for more information.  COVID-19 INFECTION: The incubation period of COVID-19 is approximately 14 days after exposure, with most symptoms developing in roughly 4-5 days. Symptoms may range in severity from mild to critically severe. Roughly 80% of those infected will have mild symptoms. People of any age may become infected with COVID-19 and have the ability to transmit the virus. The most common symptoms include: fever, fatigue, cough, body aches, headaches, sore throat, nasal congestion, shortness of breath, nausea, vomiting, diarrhea, changes in smell and/or taste.    COURSE OF ILLNESS Some patients may begin with mild disease which can  progress quickly into critical symptoms. If your symptoms are worsening please call ahead to the Emergency Department and proceed there  for further treatment. Recovery time appears to be roughly 1-2 weeks for mild symptoms and 3-6 weeks for severe disease.   GO IMMEDIATELY TO ER FOR FEVER YOU ARE UNABLE TO GET DOWN WITH TYLENOL, BREATHING PROBLEMS, CHEST PAIN, FATIGUE, LETHARGY, INABILITY TO EAT OR DRINK, ETC  QUARANTINE AND ISOLATION: To help decrease the spread of COVID-19 please remain isolated if you have COVID infection or are highly suspected to have COVID infection. This means -stay home and isolate to one room in the home if you live with others. Do not share a bed or bathroom with others while ill, sanitize and wipe down all countertops and keep common areas clean and disinfected. You may discontinue isolation if you have a mild case and are asymptomatic 10 days after symptom onset as long as you have been fever free >24 hours without having to take Motrin or Tylenol. If your case is more severe (meaning you develop pneumonia or are admitted in the hospital), you may have to isolate longer.   If you have been in close contact (within 6 feet) of someone diagnosed with COVID 19, you are advised to quarantine in your home for 14 days as symptoms can develop anywhere from 2-14 days after exposure to the virus. If you develop symptoms, you  must isolate.  Most current guidelines for COVID after exposure -isolate 10 days if you ARE NOT tested for COVID as long as symptoms do not develop -isolate 7 days if you are tested and remain asymptomatic -You do not necessarily need to be tested for COVID if you have + exposure and        develop   symptoms. Just isolate at home x10 days from symptom onset During this global pandemic, CDC advises to practice social distancing, try to stay at least 33ft away from others at all times. Wear a face covering. Wash and sanitize your hands regularly and avoid going anywhere that is not necessary.  KEEP IN MIND THAT THE COVID TEST IS NOT 100% ACCURATE AND YOU SHOULD STILL DO EVERYTHING TO PREVENT  POTENTIAL SPREAD OF VIRUS TO OTHERS (WEAR MASK, WEAR GLOVES, WASH HANDS AND SANITIZE REGULARLY). IF INITIAL TEST IS NEGATIVE, THIS MAY NOT MEAN YOU ARE DEFINITELY NEGATIVE. MOST ACCURATE TESTING IS DONE 5-7 DAYS AFTER EXPOSURE.   It is not advised by CDC to get re-tested after receiving a positive COVID test since you can still test positive for weeks to months after you have already cleared the virus.   *If you have not been vaccinated for COVID, I strongly suggest you consider getting vaccinated as long as there are no contraindications.      ED Prescriptions    Medication Sig Dispense Auth. Provider   benzonatate (TESSALON) 100 MG capsule Take 2 capsules (200 mg total) by mouth 3 (three) times daily as needed for up to 10 days for cough. 30 capsule Shirlee Latch, PA-C     PDMP not reviewed this encounter.   Shirlee Latch, PA-C 03/16/20 1256

## 2020-04-30 ENCOUNTER — Ambulatory Visit
Admission: EM | Admit: 2020-04-30 | Discharge: 2020-04-30 | Disposition: A | Discharge status: Home / Self Care | Payer: BLUE CROSS/BLUE SHIELD | Attending: Internal Medicine | Admitting: Internal Medicine

## 2020-04-30 ENCOUNTER — Ambulatory Visit (INDEPENDENT_AMBULATORY_CARE_PROVIDER_SITE_OTHER): Payer: BLUE CROSS/BLUE SHIELD

## 2020-04-30 ENCOUNTER — Encounter: Payer: Self-pay | Admitting: Gynecology

## 2020-04-30 ENCOUNTER — Other Ambulatory Visit: Payer: Self-pay

## 2020-04-30 DIAGNOSIS — J029 Acute pharyngitis, unspecified: Secondary | ICD-10-CM | POA: Insufficient documentation

## 2020-04-30 DIAGNOSIS — R059 Cough, unspecified: Secondary | ICD-10-CM | POA: Diagnosis not present

## 2020-04-30 DIAGNOSIS — I10 Essential (primary) hypertension: Secondary | ICD-10-CM | POA: Diagnosis not present

## 2020-04-30 DIAGNOSIS — R062 Wheezing: Secondary | ICD-10-CM

## 2020-04-30 DIAGNOSIS — J189 Pneumonia, unspecified organism: Secondary | ICD-10-CM | POA: Insufficient documentation

## 2020-04-30 DIAGNOSIS — G5603 Carpal tunnel syndrome, bilateral upper limbs: Secondary | ICD-10-CM | POA: Insufficient documentation

## 2020-04-30 DIAGNOSIS — Z7901 Long term (current) use of anticoagulants: Secondary | ICD-10-CM | POA: Diagnosis not present

## 2020-04-30 DIAGNOSIS — Z79899 Other long term (current) drug therapy: Secondary | ICD-10-CM | POA: Insufficient documentation

## 2020-04-30 DIAGNOSIS — M1712 Unilateral primary osteoarthritis, left knee: Secondary | ICD-10-CM | POA: Diagnosis not present

## 2020-04-30 DIAGNOSIS — J329 Chronic sinusitis, unspecified: Secondary | ICD-10-CM | POA: Insufficient documentation

## 2020-04-30 DIAGNOSIS — Z87891 Personal history of nicotine dependence: Secondary | ICD-10-CM | POA: Insufficient documentation

## 2020-04-30 DIAGNOSIS — R5383 Other fatigue: Secondary | ICD-10-CM | POA: Diagnosis not present

## 2020-04-30 DIAGNOSIS — Z20822 Contact with and (suspected) exposure to covid-19: Secondary | ICD-10-CM | POA: Diagnosis not present

## 2020-04-30 DIAGNOSIS — R0981 Nasal congestion: Secondary | ICD-10-CM | POA: Diagnosis not present

## 2020-04-30 MED ORDER — DOXYCYCLINE HYCLATE 100 MG PO CAPS: 100.0000 mg | ORAL_CAPSULE | Freq: Two times a day (BID) | ORAL | 0 refills | Status: AC

## 2020-04-30 MED ORDER — DOXYCYCLINE HYCLATE 100 MG PO CAPS: 100.0000 mg | ORAL_CAPSULE | Freq: Two times a day (BID) | ORAL | 0 refills | Status: DC

## 2020-04-30 NOTE — ED Provider Notes (Addendum)
[facility]    CSN: [id number] Arrival date & time: 04/30/20  1451      History   Chief Complaint Chief Complaint  Patient presents with  . Sinusitis    HPI Maurice Lee is a 59 y.o. male presenting for 6-day history of productive cough, nasal congestion, postnasal drainage.  He says that he did hear some wheezing last night.  Denies any shortness of breath or chest pain.  He states that he has been sick with the same thing multiple times over the past couple months.  He says it always seems to go away but takes a couple weeks.  Has been taking over-the-counter Mucinex.  No known Covid exposure risk and flares requiring him to get Covid test.  Fully vaccinated for Covid.  He denies any fevers.  He has no history of cardiopulmonary disease including COPD or asthma.  No other complaints or concerns today.  HPI  Past Medical History:  Diagnosis Date  . Complication of anesthesia   . Hypertension   . Pseudocholinesterase deficiency     Patient Active Problem List   Diagnosis Date Noted  . Primary osteoarthritis of left knee 03/26/2017  . Carpal tunnel syndrome on both sides 03/26/2017  . Elevated liver enzymes   . 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 Right upper quadrant abdominal pain 08/15/2016  . Near syncope 03/22/2016  . Left knee pain 08/24/2015  . Serum cholinesterase defect (HCC) 04/27/2015  . H/O renal calculi 02/22/2015  . Essential (primary) hypertension 06/21/2014    Past Surgical History:  Procedure Laterality Date  . COLONOSCOPY WITH PROPOFOL N/A 07/15/2016   Procedure: COLONOSCOPY WITH PROPOFOL;  Surgeon: Wyline MoodKiran Anna, MD;  Location: [facility] ENDOSCOPY;  Service: Endoscopy;  Laterality: N/A;  . ERCP N/A 08/16/2016   Procedure: ENDOSCOPIC RETROGRADE CHOLANGIOPANCREATOGRAPHY (ERCP);  Surgeon: Midge Miniumarren Wohl, MD;  [facility];  Service: Endoscopy;  Laterality: N/A;  . Kidney stones removed         Home  Medications    Prior to Admission medications   Medication Sig Start Date End Date Taking? Authorizing Provider  acetaminophen (TYLENOL) 650 MG CR tablet Take 650 mg by mouth every 8 (eight) hours as needed for pain.   Yes [provider]  atorvastatin (LIPITOR) 40 MG tablet Take 40 mg by mouth daily. 01/24/20  Yes [provider]  famotidine (PEPCID) 40 MG tablet Take 1 tablet (40 mg total) by mouth daily. 03/31/19  Yes Margaretann LovelessBurnette, Jennifer M, PA-C  lisinopril (ZESTRIL) 40 MG tablet TAKE 1 TABLET BY MOUTH ONCE DAILY 02/08/19  Yes Margaretann LovelessBurnette, Jennifer M, PA-C  naproxen sodium (ANAPROX) 550 MG tablet naproxen sodium 550 mg tablet  Take 1 tablet every 12 hours by oral route.   Yes [provider]  doxycycline (VIBRAMYCIN) 100 MG capsule Take 1 capsule (100 mg total) by mouth 2 (two) times daily for 7 days. 04/30/20 05/07/20  Shirlee LatchEaves, Shaday Rayborn B, PA-C    Family History Family History  Problem Relation Age of Onset  . COPD Mother   . Thyroid disease Mother   . Lupus Mother   . Congestive Heart Failure Mother   . Cancer Father     Social History Social History   Tobacco Use  . Smoking status: Former Smoker    Packs/day: 1.50    Years: 40.00    Pack years: 60.00  . Smokeless tobacco: Never Used  Vaping Use  . Vaping Use: Never used  Substance Use Topics  .  Alcohol use: No  . Drug use: No     Allergies   Mivacurium, Succinylcholine, Nabumetone, and Sulfa antibiotics   Review of Systems Review of Systems  Constitutional: Positive for fatigue. Negative for fever.  HENT: Positive for congestion, rhinorrhea, sinus pressure and sore throat. Negative for sinus pain.   Respiratory: Positive for cough and wheezing. Negative for shortness of breath.   Gastrointestinal: Negative for abdominal pain, diarrhea, nausea and vomiting.  Musculoskeletal: Negative for myalgias.  Neurological: Positive for headaches. Negative for weakness and light-headedness.  Hematological:  Negative for adenopathy.     Physical Exam Triage Vital Signs ED Triage Vitals  Enc Vitals Group     BP 04/30/20 1537 (!) 153/70     Pulse Rate 04/30/20 1537 66     Resp 04/30/20 1537 16     Temp 04/30/20 1537 98.2 F (36.8 C)     Temp Source 04/30/20 1537 Oral     SpO2 04/30/20 1537 100 %     Weight 04/30/20 1539 210 lb (95.3 kg)     Height 04/30/20 1539 5\' 7"  (1.702 m)     Head Circumference --      Peak Flow --      Pain Score 04/30/20 1539 6     Pain Loc --      Pain Edu? --      Excl. in GC? --    No data found.  Updated Vital Signs BP (!) 153/70 (BP Location: Left Arm)   Pulse 66   Temp 98.2 F (36.8 C) (Oral)   Resp 16   Ht 5\' 7"  (1.702 m)   Wt 210 lb (95.3 kg)   SpO2 100%   BMI 32.89 kg/m       Physical Exam Vitals and nursing note reviewed.  Constitutional:      General: He is not in acute distress.    Appearance: Normal appearance. He is well-developed. He is obese. He is not toxic-appearing.  HENT:     Head: Normocephalic and atraumatic.     Nose: Congestion and rhinorrhea present.     Mouth/Throat:     Pharynx: Posterior oropharyngeal erythema present.  Eyes:     General: No scleral icterus.    Conjunctiva/sclera: Conjunctivae normal.  Cardiovascular:     Rate and Rhythm: Normal rate and regular rhythm.     Heart sounds: No murmur heard.   Pulmonary:     Effort: Pulmonary effort is normal. No respiratory distress.     Breath sounds: Examination of the right-upper field reveals wheezing. Examination of the right-middle field reveals wheezing. Examination of the right-lower field reveals wheezing. Wheezing present.  Abdominal:     Tenderness: There is no abdominal tenderness.  Musculoskeletal:     Cervical back: Neck supple.  Skin:    General: Skin is warm and dry.  Neurological:     General: No focal deficit present.     Mental Status: He is alert. Mental status is at baseline.     Motor: No weakness.     Gait: Gait normal.    Psychiatric:        Mood and Affect: Mood normal.        Behavior: Behavior normal.        Thought Content: Thought content normal.      UC Treatments / Results  Labs (all labs ordered are listed, but only abnormal results are displayed) Labs Reviewed  SARS CORONAVIRUS 2 (TAT 6-24 HRS)    EKG  Radiology DG Chest 2 View  Result Date: 04/30/2020 CLINICAL DATA:  Cough, wheezing. EXAM: CHEST - 2 VIEW COMPARISON:  None. FINDINGS: The heart size and mediastinal contours are within normal limits. Both lungs are clear. The visualized skeletal structures are unremarkable. IMPRESSION: No active cardiopulmonary disease. Electronically Signed   By: Lupita Raider M.D.   On: 04/30/2020 16:14    Procedures Procedures (including critical care time)  Medications Ordered in UC Medications - No data to display  Initial Impression / Assessment and Plan / UC Course  I have reviewed the triage vital signs and the nursing notes.  Pertinent labs & imaging results that were available during my care of the patient were reviewed by me and considered in my medical decision making (see chart for details).   Covid testing obtained today.  CDC guidelines, isolation protocol and ED precautions discussed with patient.  Chest x-ray performed due to hearing wheezing on the right side only.  All vital signs are stable, blood pressure little bit elevated 153/70.  Oxygen is 100%.  Chest x-ray appears to show right middle lobe pneumonia.  Right heart border is obscured. Radiology over read does not agree and interprets the x-ray is normal.  However, I believe the x-ray shows right lower lobe pneumonia and this is consistent with the area I hear wheezing.  Suspect walking pneumonia so treating this with doxycycline at this time.  Additionally, doxycycline should cover any possible sinusitis.  Patient denies shortness of breath so inhaler not provided at this time.  Advised him to continue Mucinex and begin the  doxycycline.  Advised him to follow-up with PCP for recheck in another week or so.  Advise repeat chest x-ray in the next 3 weeks.  Advised for him to return for repeat exam sooner if he develops any new or worsening symptoms including fever if he starts to have shortness of breath or worsening cough.  Patient agreeable.   Final Clinical Impressions(s) / UC Diagnoses   Final diagnoses:  Cough  Sinus congestion  Wheezing  Community acquired pneumonia of right middle lobe of lung     Discharge Instructions     Begin antibiotics at this time to cover you for suspected community-acquired pneumonia.  The radiologist who read your x-ray is negative for pneumonia, but it appears to me that there is a patchy area consistent with pneumonia where you hear the wheezing I am listening to her lungs.  Continue Mucinex and increasing fluid intake.  If still not feeling well over the next week, consider stopping Mucinex and starting Claritin-D for possibility that you have allergies that could be causing these recurrent symptoms.  You have received COVID testing today either for positive exposure, concerning symptoms that could be related to COVID infection, screening purposes, or re-testing after confirmed positive.  Your test obtained today checks for active viral infection in the last 1-2 weeks. If your test is negative now, you can still test positive later. So, if you do develop symptoms you should either get re-tested and/or isolate x 10 days. Please follow CDC guidelines.  While Rapid antigen tests come back in 15-20 minutes, send out PCR/molecular test results typically come back within 24 hours. In the mean time, if you are symptomatic, assume this could be a positive test and treat/monitor yourself as if you do have COVID.   We will call with test results. Please download the MyChart app and set up a profile to access test results.   If symptomatic,  go home and rest. Push fluids. Take Tylenol as  needed for discomfort. Gargle warm salt water. Throat lozenges. Take Mucinex DM or Robitussin for cough. Humidifier in bedroom to ease coughing. Warm showers. Also review the COVID handout for more information.  COVID-19 INFECTION: The incubation period of COVID-19 is approximately 14 days after exposure, with most symptoms developing in roughly 4-5 days. Symptoms may range in severity from mild to critically severe. Roughly 80% of those infected will have mild symptoms. People of any age may become infected with COVID-19 and have the ability to transmit the virus. The most common symptoms include: fever, fatigue, cough, body aches, headaches, sore throat, nasal congestion, shortness of breath, nausea, vomiting, diarrhea, changes in smell and/or taste.    COURSE OF ILLNESS Some patients may begin with mild disease which can progress quickly into critical symptoms. If your symptoms are worsening please call ahead to the Emergency Department and proceed there for further treatment. Recovery time appears to be roughly 1-2 weeks for mild symptoms and 3-6 weeks for severe disease.   GO IMMEDIATELY TO ER FOR FEVER YOU ARE UNABLE TO GET DOWN WITH TYLENOL, BREATHING PROBLEMS, CHEST PAIN, FATIGUE, LETHARGY, INABILITY TO EAT OR DRINK, ETC  QUARANTINE AND ISOLATION: To help decrease the spread of COVID-19 please remain isolated if you have COVID infection or are highly suspected to have COVID infection. This means -stay home and isolate to one room in the home if you live with others. Do not share a bed or bathroom with others while ill, sanitize and wipe down all countertops and keep common areas clean and disinfected. You may discontinue isolation if you have a mild case and are asymptomatic 10 days after symptom onset as long as you have been fever free >24 hours without having to take Motrin or Tylenol. If your case is more severe (meaning you develop pneumonia or are admitted in the hospital), you may have to  isolate longer.   If you have been in close contact (within 6 feet) of someone diagnosed with COVID 19, you are advised to quarantine in your home for 14 days as symptoms can develop anywhere from 2-14 days after exposure to the virus. If you develop symptoms, you  must isolate.  Most current guidelines for COVID after exposure -isolate 10 days if you ARE NOT tested for COVID as long as symptoms do not develop -isolate 7 days if you are tested and remain asymptomatic -You do not necessarily need to be tested for COVID if you have + exposure and        develop   symptoms. Just isolate at home x10 days from symptom onset During this global pandemic, CDC advises to practice social distancing, try to stay at least 32ft away from others at all times. Wear a face covering. Wash and sanitize your hands regularly and avoid going anywhere that is not necessary.  KEEP IN MIND THAT THE COVID TEST IS NOT 100% ACCURATE AND YOU SHOULD STILL DO EVERYTHING TO PREVENT POTENTIAL SPREAD OF VIRUS TO OTHERS (WEAR MASK, WEAR GLOVES, WASH HANDS AND SANITIZE REGULARLY). IF INITIAL TEST IS NEGATIVE, THIS MAY NOT MEAN YOU ARE DEFINITELY NEGATIVE. MOST ACCURATE TESTING IS DONE 5-7 DAYS AFTER EXPOSURE.   It is not advised by CDC to get re-tested after receiving a positive COVID test since you can still test positive for weeks to months after you have already cleared the virus.   *If you have not been vaccinated for COVID, I strongly suggest  you consider getting vaccinated as long as there are no contraindications.      ED Prescriptions    Medication Sig Dispense Auth. Provider   doxycycline (VIBRAMYCIN) 100 MG capsule  (Status: Discontinued) Take 1 capsule (100 mg total) by mouth 2 (two) times daily for 7 days. 14 capsule Eusebio Friendly B, PA-C   doxycycline (VIBRAMYCIN) 100 MG capsule Take 1 capsule (100 mg total) by mouth 2 (two) times daily for 7 days. 14 capsule Shirlee Latch, PA-C     PDMP not reviewed this  encounter.   Shirlee Latch, PA-C 05/01/20 2100    Shirlee Latch, PA-C 05/01/20 2103

## 2020-04-30 NOTE — ED Triage Notes (Signed)
Patient c/o sinus problem x 6 days. Pt. Sx cough, nasal drip. Per patient will need covid test to return to work.

## 2020-04-30 NOTE — Discharge Instructions (Addendum)
Begin antibiotics at this time to cover you for suspected community-acquired pneumonia.  The radiologist who read your x-ray is negative for pneumonia, but it appears to me that there is a patchy area consistent with pneumonia where you hear the wheezing I am listening to her lungs.  Continue Mucinex and increasing fluid intake.  If still not feeling well over the next week, consider stopping Mucinex and starting Claritin-D for possibility that you have allergies that could be causing these recurrent symptoms.  You have received COVID testing today either for positive exposure, concerning symptoms that could be related to COVID infection, screening purposes, or re-testing after confirmed positive.  Your test obtained today checks for active viral infection in the last 1-2 weeks. If your test is negative now, you can still test positive later. So, if you do develop symptoms you should either get re-tested and/or isolate x 10 days. Please follow CDC guidelines.  While Rapid antigen tests come back in 15-20 minutes, send out PCR/molecular test results typically come back within 24 hours. In the mean time, if you are symptomatic, assume this could be a positive test and treat/monitor yourself as if you do have COVID.   We will call with test results. Please download the MyChart app and set up a profile to access test results.   If symptomatic, go home and rest. Push fluids. Take Tylenol as needed for discomfort. Gargle warm salt water. Throat lozenges. Take Mucinex DM or Robitussin for cough. Humidifier in bedroom to ease coughing. Warm showers. Also review the COVID handout for more information.  COVID-19 INFECTION: The incubation period of COVID-19 is approximately 14 days after exposure, with most symptoms developing in roughly 4-5 days. Symptoms may range in severity from mild to critically severe. Roughly 80% of those infected will have mild symptoms. People of any age may become infected with COVID-19  and have the ability to transmit the virus. The most common symptoms include: fever, fatigue, cough, body aches, headaches, sore throat, nasal congestion, shortness of breath, nausea, vomiting, diarrhea, changes in smell and/or taste.    COURSE OF ILLNESS Some patients may begin with mild disease which can progress quickly into critical symptoms. If your symptoms are worsening please call ahead to the Emergency Department and proceed there for further treatment. Recovery time appears to be roughly 1-2 weeks for mild symptoms and 3-6 weeks for severe disease.   GO IMMEDIATELY TO ER FOR FEVER YOU ARE UNABLE TO GET DOWN WITH TYLENOL, BREATHING PROBLEMS, CHEST PAIN, FATIGUE, LETHARGY, INABILITY TO EAT OR DRINK, ETC  QUARANTINE AND ISOLATION: To help decrease the spread of COVID-19 please remain isolated if you have COVID infection or are highly suspected to have COVID infection. This means -stay home and isolate to one room in the home if you live with others. Do not share a bed or bathroom with others while ill, sanitize and wipe down all countertops and keep common areas clean and disinfected. You may discontinue isolation if you have a mild case and are asymptomatic 10 days after symptom onset as long as you have been fever free >24 hours without having to take Motrin or Tylenol. If your case is more severe (meaning you develop pneumonia or are admitted in the hospital), you may have to isolate longer.   If you have been in close contact (within 6 feet) of someone diagnosed with COVID 19, you are advised to quarantine in your home for 14 days as symptoms can develop anywhere from 2-14 days  after exposure to the virus. If you develop symptoms, you  must isolate.  Most current guidelines for COVID after exposure -isolate 10 days if you ARE NOT tested for COVID as long as symptoms do not develop -isolate 7 days if you are tested and remain asymptomatic -You do not necessarily need to be tested for COVID if  you have + exposure and        develop   symptoms. Just isolate at home x10 days from symptom onset During this global pandemic, CDC advises to practice social distancing, try to stay at least 78ft away from others at all times. Wear a face covering. Wash and sanitize your hands regularly and avoid going anywhere that is not necessary.  KEEP IN MIND THAT THE COVID TEST IS NOT 100% ACCURATE AND YOU SHOULD STILL DO EVERYTHING TO PREVENT POTENTIAL SPREAD OF VIRUS TO OTHERS (WEAR MASK, WEAR GLOVES, WASH HANDS AND SANITIZE REGULARLY). IF INITIAL TEST IS NEGATIVE, THIS MAY NOT MEAN YOU ARE DEFINITELY NEGATIVE. MOST ACCURATE TESTING IS DONE 5-7 DAYS AFTER EXPOSURE.   It is not advised by CDC to get re-tested after receiving a positive COVID test since you can still test positive for weeks to months after you have already cleared the virus.   *If you have not been vaccinated for COVID, I strongly suggest you consider getting vaccinated as long as there are no contraindications.

## 2020-05-01 LAB — SARS CORONAVIRUS 2 (TAT 6-24 HRS): SARS Coronavirus 2: NEGATIVE

## 2020-05-03 ENCOUNTER — Telehealth: Payer: Self-pay | Admitting: Physician Assistant

## 2020-05-03 NOTE — Telephone Encounter (Signed)
Patient still feeling ill, but a little better from Sunday, requesting work note for today

## 2020-07-14 ENCOUNTER — Ambulatory Visit
Admission: EM | Admit: 2020-07-14 | Discharge: 2020-07-14 | Disposition: A | Discharge status: Home / Self Care | Payer: BLUE CROSS/BLUE SHIELD | Attending: Family Medicine | Admitting: Family Medicine

## 2020-07-14 ENCOUNTER — Other Ambulatory Visit: Payer: Self-pay

## 2020-07-14 ENCOUNTER — Encounter: Payer: Self-pay | Admitting: Emergency Medicine

## 2020-07-14 DIAGNOSIS — H539 Unspecified visual disturbance: Secondary | ICD-10-CM

## 2020-07-14 NOTE — Discharge Instructions (Signed)
[facility] - [address] [place], [place]  [address] Phone: [phone]  Take care  Dr. Adriana Simas

## 2020-07-14 NOTE — ED Triage Notes (Signed)
Patient reports having vision problem in his right eye that started yesterday.  Patient describes it has having a haze over his eye.  Patient denies any pain.

## 2020-07-14 NOTE — ED Provider Notes (Signed)
[facility]    CSN: [id number] Arrival date & time: 07/14/20  1034      History   Chief Complaint Chief Complaint  Patient presents with   Eye Problem    Right eye   HPI  59 year old male presents with the above complaint.  Patient reports that he has had blurry vision in the right eye since last night.  Started around 10 PM and has persisted.  He states that he feels like there is a haze over his eye.  Denies pain.  No trauma, fall, injury.  No redness.  No discharge from the eye.  No medications or interventions tried.  No no relieving factors.  No known exacerbating factors.  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   H/O renal calculi 02/22/2015   Essential (primary) hypertension 06/21/2014    Past Surgical History:  Procedure Laterality Date   COLONOSCOPY WITH PROPOFOL N/A 07/15/2016   Procedure: COLONOSCOPY WITH PROPOFOL;  Surgeon: Wyline Mood, MD;  Location: [facility] ENDOSCOPY;  Service: Endoscopy;  Laterality: N/A;   ERCP N/A 08/16/2016   Procedure: ENDOSCOPIC RETROGRADE CHOLANGIOPANCREATOGRAPHY (ERCP);  Surgeon: Midge Minium, MD;  Location: [facility];  Service: Endoscopy;  Laterality: N/A;   Kidney stones removed         Home Medications    Prior to Admission medications   Medication Sig Start Date End Date Taking? Authorizing Provider  atorvastatin (LIPITOR) 40 MG tablet Take 40 mg by mouth daily. 01/24/20  Yes [provider]  famotidine (PEPCID) 40  MG tablet Take 1 tablet (40 mg total) by mouth daily. 03/31/19  Yes Margaretann Loveless, PA-C  lisinopril (ZESTRIL) 40 MG tablet TAKE 1 TABLET BY MOUTH ONCE DAILY 02/08/19  Yes Margaretann Loveless, PA-C  acetaminophen (TYLENOL) 650 MG CR tablet Take 650 mg by mouth every 8 (eight) hours as needed for pain.    [provider]  naproxen sodium (ANAPROX) 550 MG tablet naproxen sodium 550 mg tablet  Take 1 tablet every 12 hours by oral route.    [provider]    Family History Family History  Problem Relation Age of Onset   COPD Mother    Thyroid disease Mother    Lupus Mother    Congestive Heart Failure Mother    Cancer Father     Social History Social History   Tobacco Use   Smoking status: Former Smoker    Packs/day: 1.50    Years: 40.00    Pack years: 60.00   Smokeless tobacco: Never Used  Building services engineer Use: Never used  Substance Use Topics   Alcohol use: No   Drug use: No     Allergies   Mivacurium, Succinylcholine, Nabumetone, and Sulfa antibiotics   Review of Systems Review of Systems  Constitutional: Negative.   Eyes: Positive for visual disturbance. Negative for photophobia, pain, discharge, redness and itching.   Physical Exam Triage Vital Signs ED Triage Vitals  Enc Vitals Group  BP 07/14/20 1050 (!) 141/67     Pulse Rate 07/14/20 1050 61     Resp 07/14/20 1050 16     Temp 07/14/20 1050 97.9 F (36.6 C)     Temp Source 07/14/20 1050 Oral     SpO2 07/14/20 1050 98 %     Weight 07/14/20 1047 217 lb (98.4 kg)     Height 07/14/20 1047 5\' 7"  (1.702 m)     Head Circumference --      Peak Flow --      Pain Score 07/14/20 1047 0     Pain Loc --      Pain Edu? --      Excl. in GC? --    Updated Vital Signs BP (!) 141/67 (BP Location: Right Arm)    Pulse 61    Temp 97.9 F (36.6 C) (Oral)    Resp 16    Ht 5\' 7"  (1.702 m)    Wt 98.4 kg    SpO2 98%    BMI 33.99 kg/m   Visual Acuity Right Eye Distance: 20/40  uncorrected Left Eye Distance: 20/25 uncorrected Bilateral Distance: 20/20 uncorrected  Right Eye Near:   Left Eye Near:    Bilateral Near:     Physical Exam Vitals and nursing note reviewed.  Constitutional:      General: He is not in acute distress.    Appearance: Normal appearance. He is not ill-appearing.  HENT:     Head: Normocephalic and atraumatic.  Eyes:     General:        Right eye: No discharge.        Left eye: No discharge.     Conjunctiva/sclera: Conjunctivae normal.     Pupils: Pupils are equal, round, and reactive to light.  Neurological:     Mental Status: He is alert.  Psychiatric:        Mood and Affect: Mood normal.        Behavior: Behavior normal.    UC Treatments / Results  Labs (all labs ordered are listed, but only abnormal results are displayed) Labs Reviewed - No data to display  EKG   Radiology No results found.  Procedures Procedures (including critical care time)  Medications Ordered in UC Medications - No data to display  Initial Impression / Assessment and Plan / UC Course  I have reviewed the triage vital signs and the nursing notes.  Pertinent labs & imaging results that were available during my care of the patient were reviewed by me and considered in my medical decision making (see chart for details).    59 year old male presents with vision change/blurry vision of the right eye. Exam unremarkable.  There is no evidence of infection or trauma.  Visual acuity 20/40 in the right eye.  This is a significant change.  Patient needs an ophthalmology go evaluation so that his retina can be fully evaluated.  Called [facility] and patient is scheduled to see them at 145 later today.  Final Clinical Impressions(s) / UC Diagnoses   Final diagnoses:  Change in vision     Discharge Instructions     [facility] app[facility] - [address] [place], [address]  [place] Phone: [phone]  Take care  Dr. [id number]     ED  Prescriptions    None     PDMP not reviewed this encounter.   [phone], Adriana Simas 07/14/20 1203

## 2023-04-29 ENCOUNTER — Other Ambulatory Visit: Payer: Self-pay

## 2023-04-29 ENCOUNTER — Emergency Department
Admission: EM | Admit: 2023-04-29 | Discharge: 2023-04-29 | Disposition: A | Discharge status: Home / Self Care | Payer: Self-pay | Attending: Emergency Medicine | Admitting: Emergency Medicine

## 2023-04-29 ENCOUNTER — Emergency Department: Payer: Self-pay

## 2023-04-29 DIAGNOSIS — Y990 Civilian activity done for income or pay: Secondary | ICD-10-CM | POA: Diagnosis not present

## 2023-04-29 DIAGNOSIS — W228XXA Striking against or struck by other objects, initial encounter: Secondary | ICD-10-CM | POA: Diagnosis not present

## 2023-04-29 DIAGNOSIS — S60212A Contusion of left wrist, initial encounter: Secondary | ICD-10-CM | POA: Diagnosis not present

## 2023-04-29 DIAGNOSIS — S6992XA Unspecified injury of left wrist, hand and finger(s), initial encounter: Secondary | ICD-10-CM | POA: Diagnosis present

## 2023-04-29 MED ORDER — HYDROCODONE-ACETAMINOPHEN 5-325 MG PO TABS
1.0000 | ORAL_TABLET | Freq: Once | ORAL | Status: AC
  Administered 2023-04-29: 1 via ORAL
  Filled 2023-04-29: qty 1

## 2023-04-29 NOTE — ED Triage Notes (Signed)
Patient arrives from home today c/o injury to the left wrist at work. Patient states that a magnetic handle that flipped loose and hit him in the wrist. Patient c/o l wrist pain that radiates through the left arm. Left wrist appears to be swollen. Patient states injury occurred several hours ago.

## 2023-04-29 NOTE — ED Notes (Signed)
Patient given discharge instructions including importance of follow up appt as needed with stated understanding. Wrist brace applied. Patient stable and ambulatory with steady even gait on dispo.

## 2023-04-29 NOTE — Discharge Instructions (Addendum)
Your exam and XR are normal and reassuring. There is no evidence of any fracture. Wear the wrist brace for comfort and support. Apply ice to reduce swelling.

## 2023-04-30 NOTE — ED Provider Notes (Signed)
[facility] Department Provider Note     Event Date/Time   First MD Initiated Contact with Patient 04/29/23 2138     (approximate)   History   Wrist Pain   HPI  Maurice Lee is a 62 y.o. male presents himself to the ED for evaluation of an acute left wrist injury at home.  Patient was using a magnetic handle and the handle broke loose, flipping over his back and the patient on top of the left wrist.  He presents with dorsal left wrist pain and swelling.  He noticed some pain that radiated to the arm that the day.  He notes some swelling to the wrist but denies any other injury at this time.  Physical Exam   Triage Vital Signs: ED Triage Vitals  Encounter Vitals Group     BP 04/29/23 2026 (!) 178/75     Systolic BP Percentile --      Diastolic BP Percentile --      Pulse Rate 04/29/23 2026 66     Resp 04/29/23 2026 18     Temp 04/29/23 2026 97.8 F (36.6 C)     Temp Source 04/29/23 2026 Oral     SpO2 04/29/23 2026 96 %     Weight 04/29/23 2026 196 lb (88.9 kg)     Height 04/29/23 2026 5\' 7"  (1.702 m)     Head Circumference --      Peak Flow --      Pain Score 04/29/23 2027 10     Pain Loc --      Pain Education --      Exclude from Growth Chart --     Most recent vital signs: Vitals:   04/29/23 2026  BP: (!) 178/75  Pulse: 66  Resp: 18  Temp: 97.8 F (36.6 C)  SpO2: 96%    General Awake, no distress.  CV:  Good peripheral perfusion.  RESP:  Normal effort.  ABD:  No distention.  MSK:  Left wrist with dorsal soft tissue swelling over the radial dorsal aspect.  Normal composite fist distally.  Normal wrist flexion extension range on exam. NEURO: Nerves II to XII grossly intact.  Normal interest and opposition testing noted.   ED Results / Procedures / Treatments   Labs (all labs ordered are listed, but only abnormal results are displayed) Labs Reviewed - No data to display   EKG   RADIOLOGY  I personally  viewed and evaluated these images as part of my medical decision making, as well as reviewing the written report by the radiologist.  ED Provider Interpretation: No acute fractures noted  DG Wrist Left  IMPRESSION: No acute abnormality noted.  PROCEDURES:  Critical Care performed: No  Procedures   MEDICATIONS ORDERED IN ED: Medications  HYDROcodone-acetaminophen (NORCO/VICODIN) 5-325 MG per tablet 1 tablet (1 tablet Oral Given 04/29/23 2226)     IMPRESSION / MDM / ASSESSMENT AND PLAN / ED COURSE  I reviewed the triage vital signs and the nursing notes.                              Differential diagnosis includes, but is not limited to, sprain, wrist fracture, tendinitis, contusion, hematoma  Patient's presentation is most consistent with acute complicated illness / injury requiring diagnostic workup.  Patient's diagnosis is consistent with left wrist contusion and tendinitis.  No radiologic evidence of any acute fracture or dislocation.  Patient will be discharged home with a Velcro wrist cock-up splint and instructions to take OTC Tylenol. Patient is to follow up with primary provider as discussed as needed or otherwise directed. Patient is given ED precautions to return to the ED for any worsening or new symptoms.   FINAL CLINICAL IMPRESSION(S) / ED DIAGNOSES   Final diagnoses:  Contusion of left wrist, initial encounter     Rx / DC Orders   ED Discharge Orders     None        Note:  This document was prepared using Dragon voice recognition software and may include unintentional dictation errors.    Lissa Hoard, PA-C 04/30/23 2351    Shaune Pollack, MD 05/01/23 [phone]
